# Patient Record
Sex: Female | Born: 1985 | Race: White | Hispanic: No | Marital: Married | Smoking: Never smoker
Health system: Southern US, Community
[De-identification: ages and names within clinical notes are randomized; demographics above are authoritative.]

## PROBLEM LIST (undated history)

## (undated) ENCOUNTER — Inpatient Hospital Stay (HOSPITAL_COMMUNITY): Payer: BC Managed Care – PPO

## (undated) ENCOUNTER — Inpatient Hospital Stay (HOSPITAL_COMMUNITY): Payer: Self-pay

## (undated) DIAGNOSIS — F419 Anxiety disorder, unspecified: Secondary | ICD-10-CM

## (undated) DIAGNOSIS — O139 Gestational [pregnancy-induced] hypertension without significant proteinuria, unspecified trimester: Secondary | ICD-10-CM

## (undated) DIAGNOSIS — R519 Headache, unspecified: Secondary | ICD-10-CM

## (undated) DIAGNOSIS — F909 Attention-deficit hyperactivity disorder, unspecified type: Secondary | ICD-10-CM

## (undated) DIAGNOSIS — N200 Calculus of kidney: Secondary | ICD-10-CM

## (undated) DIAGNOSIS — K589 Irritable bowel syndrome without diarrhea: Secondary | ICD-10-CM

## (undated) DIAGNOSIS — D649 Anemia, unspecified: Secondary | ICD-10-CM

## (undated) HISTORY — DX: Anxiety disorder, unspecified: F41.9

## (undated) HISTORY — PX: WISDOM TOOTH EXTRACTION: SHX21

## (undated) HISTORY — DX: Gestational (pregnancy-induced) hypertension without significant proteinuria, unspecified trimester: O13.9

## (undated) HISTORY — DX: Irritable bowel syndrome, unspecified: K58.9

## (undated) HISTORY — DX: Attention-deficit hyperactivity disorder, unspecified type: F90.9

---
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
## 2004-09-06 ENCOUNTER — Other Ambulatory Visit: Admission: RE | Admit: 2004-09-06 | Discharge: 2004-09-06 | Payer: Self-pay | Admitting: Obstetrics and Gynecology

## 2007-01-17 ENCOUNTER — Ambulatory Visit (HOSPITAL_COMMUNITY): Admission: RE | Admit: 2007-01-17 | Discharge: 2007-01-17 | Payer: Self-pay | Admitting: Obstetrics and Gynecology

## 2008-08-11 IMAGING — US US SOFT TISSUE HEAD/NECK
1 series · 14 of 25 positions shown · non-contrast
Comparison: none

CLINICAL DATA: Thyromegaly, abnormal labs

Thyroid ultrasound:
No previous for comparison. The right lobe measures 12 x 16 x 51 mm, somewhat
inhomogeneous echotexture without discrete lesion. Isthmus 2 mm in total
thickness. Left lobe 9 x 12 x 40 mm, with similar mild inhomogeneity of the
parenchyma but no discrete lesion.

[Series 1: unknown · 0.09mm/px · 14 of 32 slices shown]
[im 1/32]
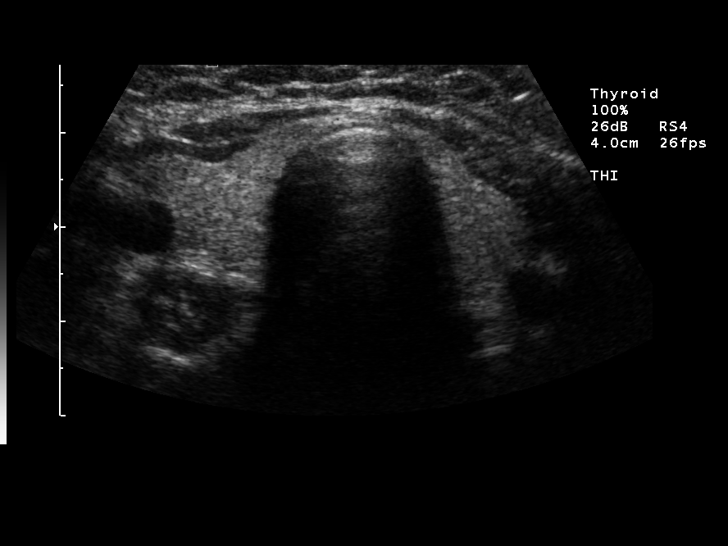
[im 3/32]
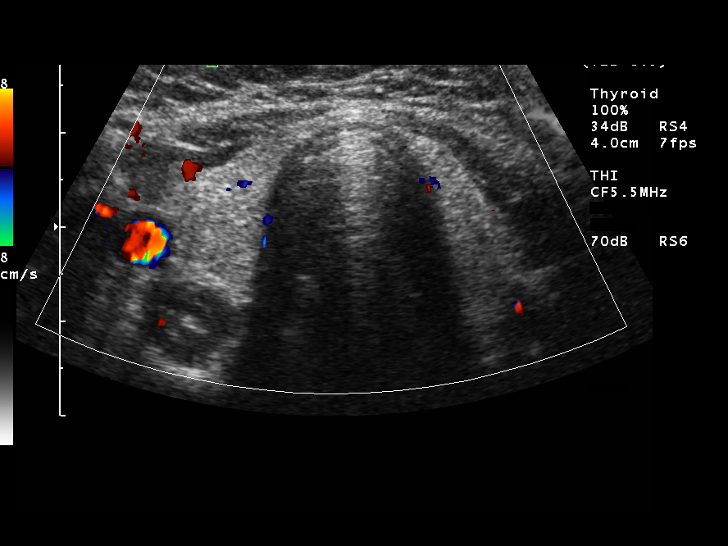
[im 6/32]
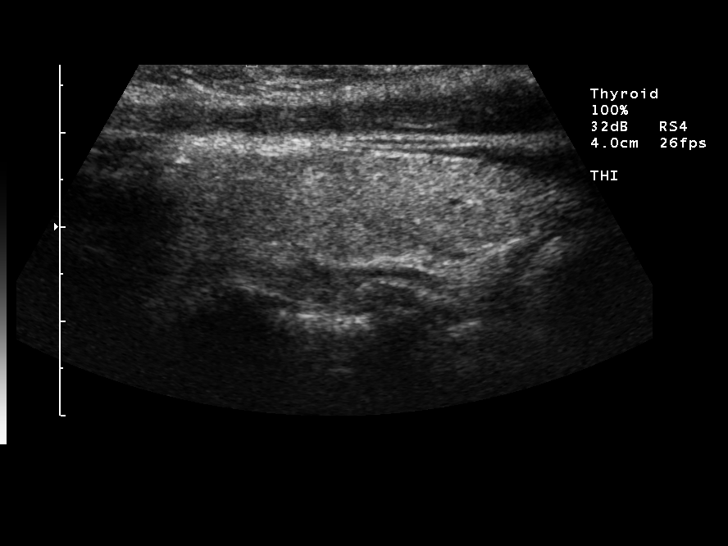
[im 8/32]
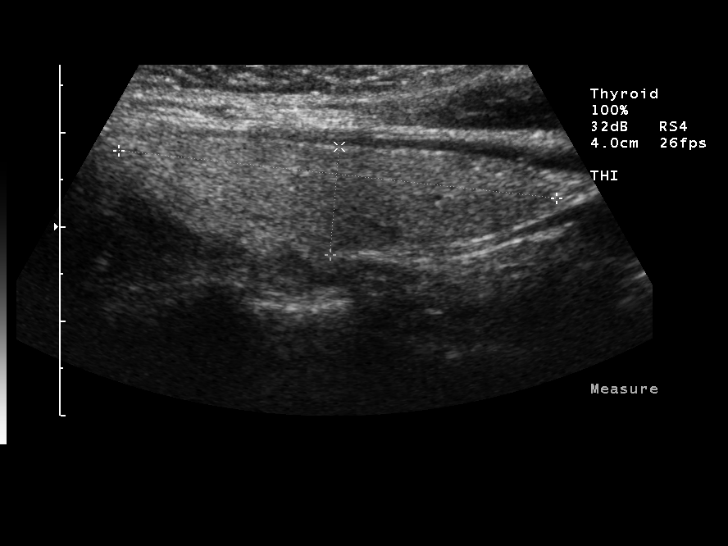
[im 11/32]
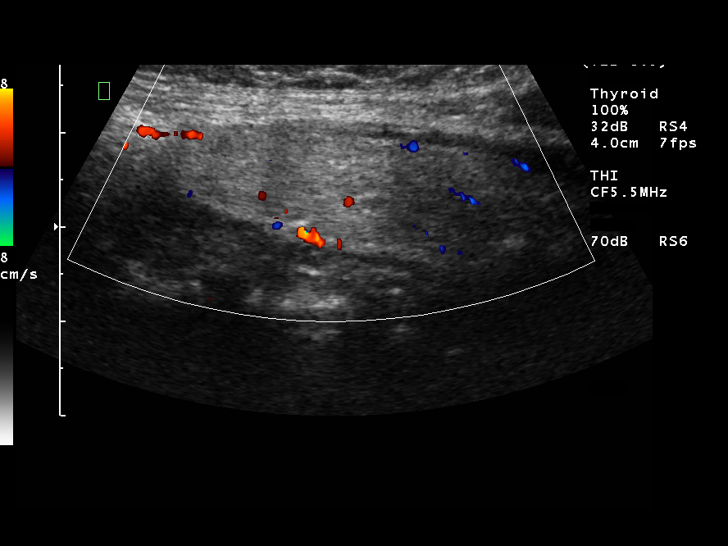
[im 12/32]
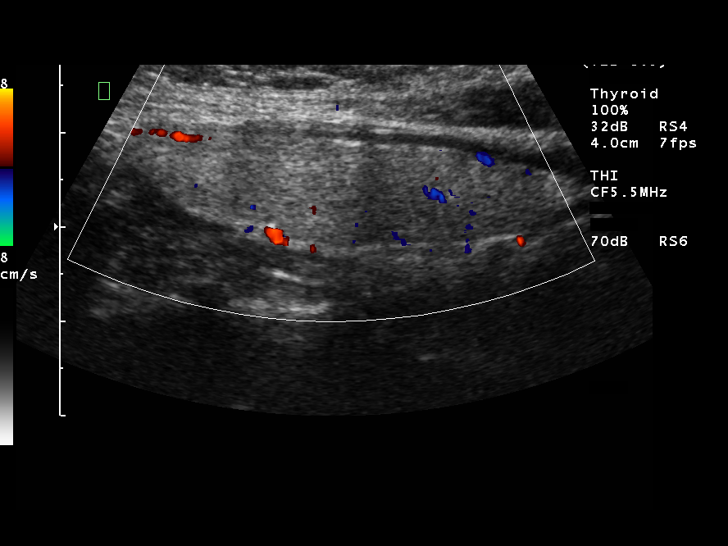
[im 15/32]
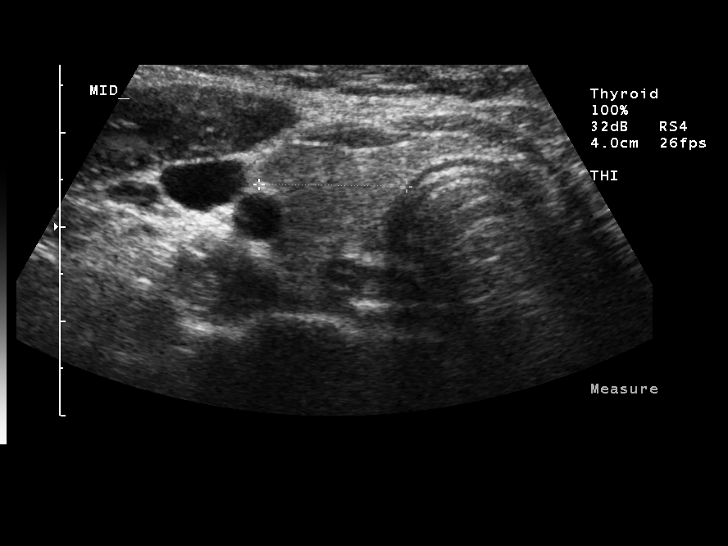
[im 17/32]
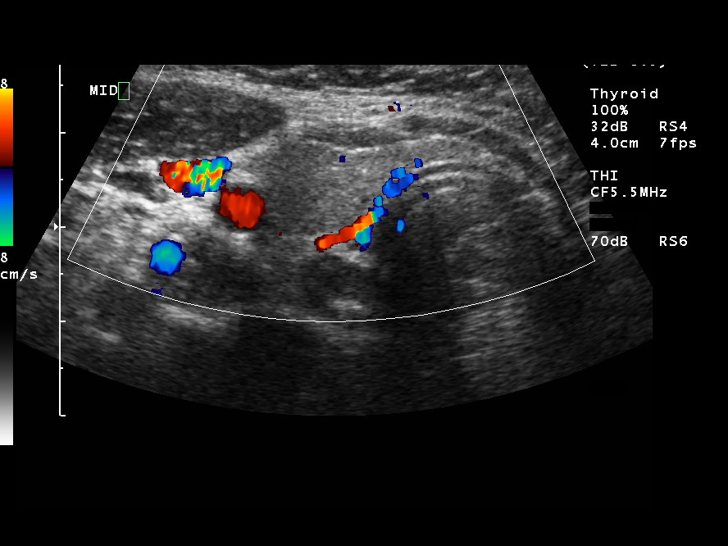
[im 20/32]
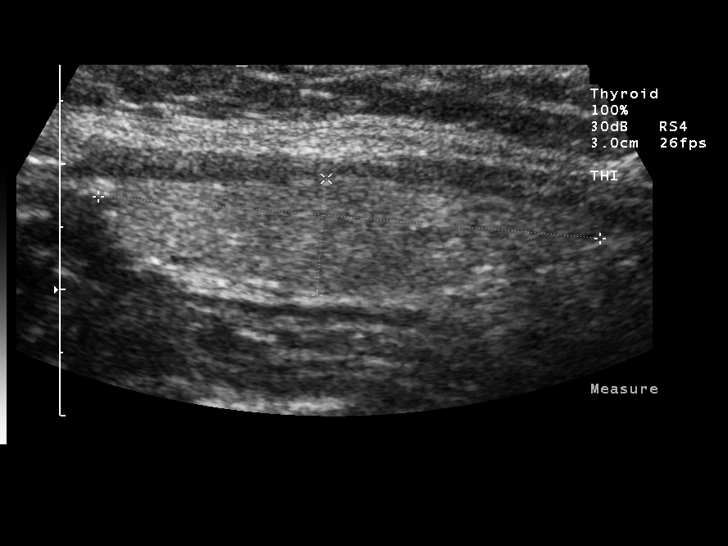
[im 21/32]
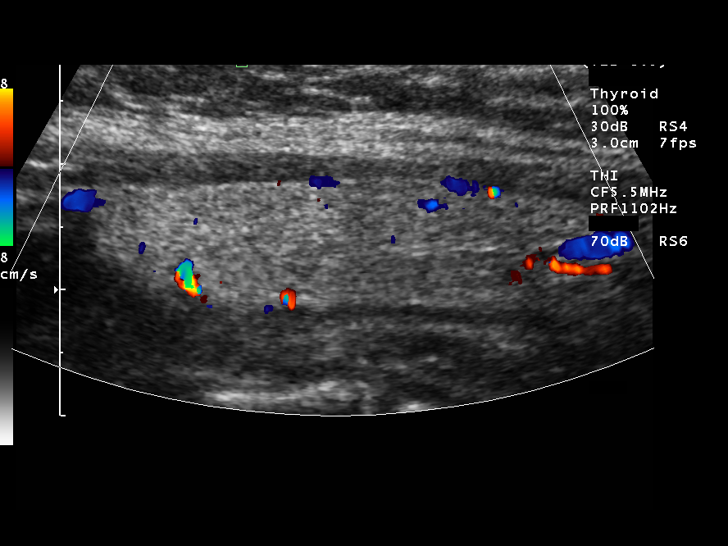
[im 24/32]
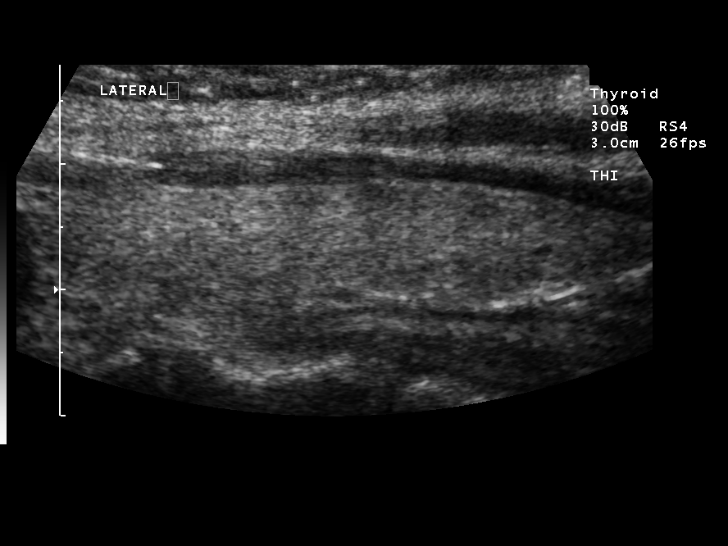
[im 26/32]
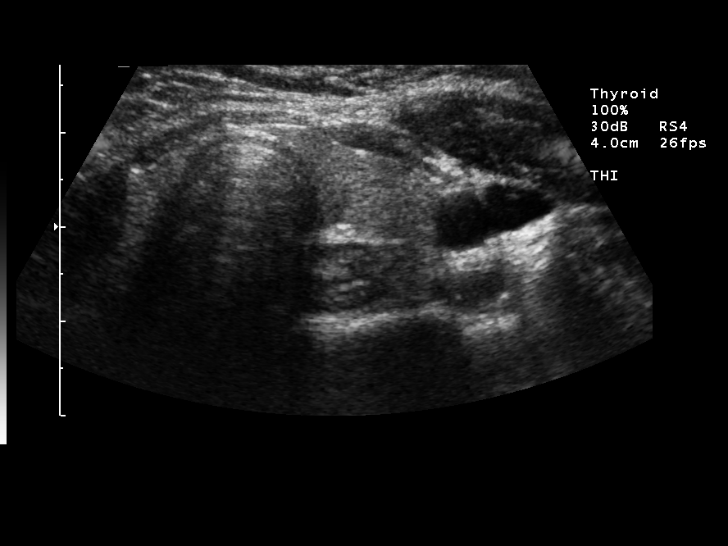
[im 29/32]
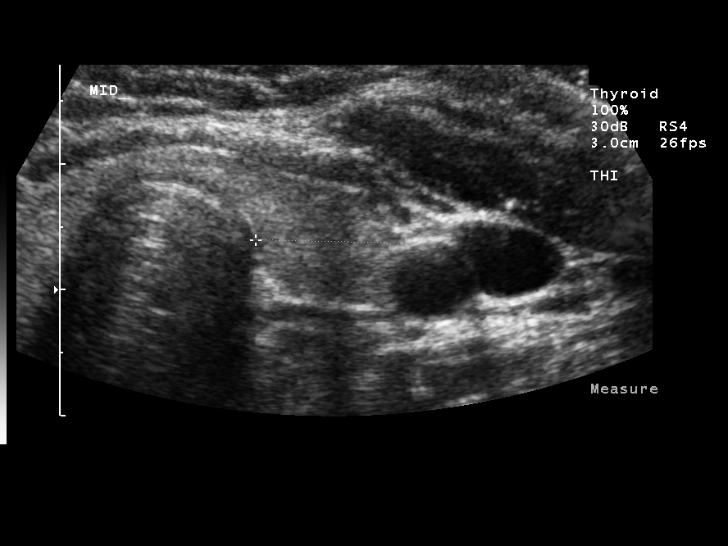
[im 32/32]
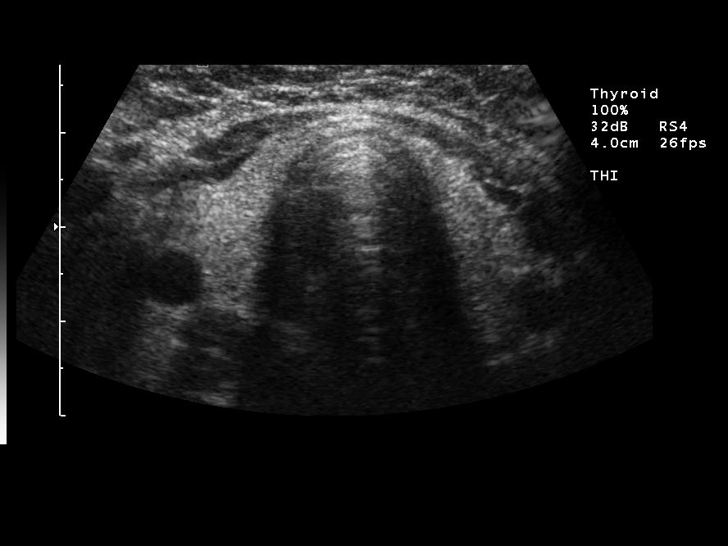

[14 of 25 positions shown; findings below may reference images not displayed]

IMPRESSION: 1. Mild parenchymal inhomogeneity with no dominant nodule or other focal lesion.

## 2010-06-05 ENCOUNTER — Encounter: Payer: Self-pay | Admitting: Endocrinology

## 2010-08-02 ENCOUNTER — Emergency Department (HOSPITAL_COMMUNITY)
Admission: EM | Admit: 2010-08-02 | Discharge: 2010-08-02 | Disposition: A | Discharge status: Home / Self Care | Payer: 59 | Attending: Emergency Medicine | Admitting: Emergency Medicine

## 2010-08-02 DIAGNOSIS — IMO0001 Reserved for inherently not codable concepts without codable children: Secondary | ICD-10-CM | POA: Insufficient documentation

## 2010-08-02 DIAGNOSIS — R509 Fever, unspecified: Secondary | ICD-10-CM | POA: Insufficient documentation

## 2010-08-02 DIAGNOSIS — R51 Headache: Secondary | ICD-10-CM | POA: Insufficient documentation

## 2010-08-02 DIAGNOSIS — R599 Enlarged lymph nodes, unspecified: Secondary | ICD-10-CM | POA: Insufficient documentation

## 2010-08-02 DIAGNOSIS — J029 Acute pharyngitis, unspecified: Secondary | ICD-10-CM | POA: Insufficient documentation

## 2010-08-02 DIAGNOSIS — R5383 Other fatigue: Secondary | ICD-10-CM | POA: Insufficient documentation

## 2010-08-02 DIAGNOSIS — R5381 Other malaise: Secondary | ICD-10-CM | POA: Insufficient documentation

## 2010-08-02 DIAGNOSIS — R000 Tachycardia, unspecified: Secondary | ICD-10-CM | POA: Insufficient documentation

## 2010-08-02 DIAGNOSIS — R110 Nausea: Secondary | ICD-10-CM | POA: Insufficient documentation

## 2010-08-03 LAB — STREP A DNA PROBE: Group A Strep Probe: NEGATIVE

## 2011-11-05 ENCOUNTER — Emergency Department (HOSPITAL_COMMUNITY)
Admission: EM | Admit: 2011-11-05 | Discharge: 2011-11-05 | Disposition: A | Discharge status: Home / Self Care | Payer: 59 | Attending: Emergency Medicine | Admitting: Emergency Medicine

## 2011-11-05 ENCOUNTER — Encounter (HOSPITAL_COMMUNITY): Payer: Self-pay | Admitting: Emergency Medicine

## 2011-11-05 DIAGNOSIS — Z79899 Other long term (current) drug therapy: Secondary | ICD-10-CM | POA: Insufficient documentation

## 2011-11-05 DIAGNOSIS — R109 Unspecified abdominal pain: Secondary | ICD-10-CM | POA: Insufficient documentation

## 2011-11-05 DIAGNOSIS — Z87442 Personal history of urinary calculi: Secondary | ICD-10-CM | POA: Insufficient documentation

## 2011-11-05 HISTORY — DX: Calculus of kidney: N20.0

## 2011-11-05 LAB — URINALYSIS, ROUTINE W REFLEX MICROSCOPIC
Glucose, UA: NEGATIVE mg/dL
Hgb urine dipstick: NEGATIVE
Leukocytes, UA: NEGATIVE
Specific Gravity, Urine: 1.031 — ABNORMAL HIGH (ref 1.005–1.030)
pH: 6.5 (ref 5.0–8.0)

## 2011-11-05 LAB — POCT I-STAT, CHEM 8
BUN: 8 mg/dL (ref 6–23)
Chloride: 103 mEq/L (ref 96–112)
HCT: 37 % (ref 36.0–46.0)
Sodium: 141 mEq/L (ref 135–145)

## 2011-11-05 LAB — POCT PREGNANCY, URINE: Preg Test, Ur: NEGATIVE

## 2011-11-05 MED ORDER — KETOROLAC TROMETHAMINE 30 MG/ML IJ SOLN: 30.0000 mg | Freq: Once | INTRAMUSCULAR | Status: AC

## 2011-11-05 MED ORDER — OXYCODONE-ACETAMINOPHEN 5-325 MG PO TABS: 1.0000 | ORAL_TABLET | ORAL | Status: AC | PRN

## 2011-11-05 MED ORDER — SODIUM CHLORIDE 0.9 % IV BOLUS (SEPSIS)
1000.0000 mL | Freq: Once | INTRAVENOUS | Status: AC
  Administered 2011-11-05: 1000 mL via INTRAVENOUS

## 2011-11-05 MED ORDER — KETOROLAC TROMETHAMINE 30 MG/ML IJ SOLN
30.0000 mg | Freq: Once | INTRAMUSCULAR | Status: DC
  Administered 2011-11-05: 30 mg via INTRAMUSCULAR
  Filled 2011-11-05: qty 1

## 2011-11-05 MED ORDER — FENTANYL CITRATE 0.05 MG/ML IJ SOLN
100.0000 ug | Freq: Once | INTRAMUSCULAR | Status: AC
  Administered 2011-11-05: 100 ug via INTRAVENOUS
  Filled 2011-11-05: qty 2

## 2011-11-05 NOTE — ED Notes (Signed)
Pt reports severe L flank pain radaiting to LLA onset this AM has hx Kidney Stones

## 2011-11-05 NOTE — ED Provider Notes (Signed)
History     CSN: [id number]  Arrival date & time 11/05/11  0340   First MD Initiated Contact with Patient 11/05/11 0356      Chief Complaint  Patient presents with  . Abdominal Pain  . Flank Pain    Hx kidney stones   HPI  History provided by the patient. Patient is a 26 year old female with history of kidney stones who presents with complaints of left flank pain that began acutely around 11 PM last night. Patient states that symptoms felt similar to previous kidney stones. Pain does radiate to left lower abdomen and groin area. Patient denies any urinary complaints. She denies any urinary frequency, dysuria, or hematuria. Patient denies any vaginal bleeding or vaginal discharge. Patient does state that she had similar symptoms on what she tells a kidney stone earlier this month but thinks that she passed it on her own. She was evaluated at that time. Symptoms tonight have not been associated with any fever, chills, sweats, nausea vomiting. Patient did attempt to take a Percocet at home without relief of symptoms.  Past Medical History  Diagnosis Date  . Kidney stones     History reviewed. No pertinent past surgical history.  History reviewed. No pertinent family history.  History  Substance Use Topics  . Smoking status: Never Smoker   . Smokeless tobacco: Not on file  . Alcohol Use: Yes    OB History    Grav Para Term Preterm Abortions TAB SAB Ect Mult Living                  Review of Systems  Constitutional: Negative for fever and chills.  Gastrointestinal: Negative for nausea and vomiting.  Genitourinary: Positive for flank pain. Negative for dysuria, frequency, hematuria, vaginal bleeding and vaginal discharge.    Allergies  Morphine and related and Vicodin  Home Medications   Current Outpatient Rx  Name Route Sig Dispense Refill  . AMPHETAMINE-DEXTROAMPHET ER 25 MG PO CP24 Oral Take 25 mg by mouth daily.    . OXYCODONE-ACETAMINOPHEN 5-325 MG PO TABS Oral  Take 1 tablet by mouth once.    Marland Kitchen PRESCRIPTION MEDICATION Oral Take 1 tablet by mouth daily. Oral contraception      BP 122/108  Pulse 110  Temp 98.5 F (36.9 C) (Oral)  Resp 18  SpO2 98%  Physical Exam  Nursing note and vitals reviewed. Constitutional: She is oriented to person, place, and time. She appears well-developed and well-nourished. No distress.  HENT:  Head: Normocephalic and atraumatic.  Cardiovascular: Normal rate and regular rhythm.   Pulmonary/Chest: Effort normal and breath sounds normal.  Abdominal: Soft. There is tenderness in the left lower quadrant. There is CVA tenderness. There is no rebound, no guarding, no tenderness at McBurney's point and negative Murphy's sign.       Left CVA tenderness  Neurological: She is alert and oriented to person, place, and time.  Skin: Skin is warm and dry. No rash noted.  Psychiatric: She has a normal mood and affect. Her behavior is normal.    ED Course  Procedures   Results for orders placed during the hospital encounter of 11/05/11  URINALYSIS, ROUTINE W REFLEX MICROSCOPIC      Component Value Range   Color, Urine YELLOW  YELLOW   APPearance CLEAR  CLEAR   Specific Gravity, Urine 1.031 (*) 1.005 - 1.030   pH 6.5  5.0 - 8.0   Glucose, UA NEGATIVE  NEGATIVE mg/dL   Hgb urine dipstick  NEGATIVE  NEGATIVE   Bilirubin Urine NEGATIVE  NEGATIVE   Ketones, ur 15 (*) NEGATIVE mg/dL   Protein, ur NEGATIVE  NEGATIVE mg/dL   Urobilinogen, UA 1.0  0.0 - 1.0 mg/dL   Nitrite NEGATIVE  NEGATIVE   Leukocytes, UA NEGATIVE  NEGATIVE  POCT PREGNANCY, URINE      Component Value Range   Preg Test, Ur NEGATIVE  NEGATIVE  POCT I-STAT, CHEM 8      Component Value Range   Sodium 141  135 - 145 mEq/L   Potassium 3.6  3.5 - 5.1 mEq/L   Chloride 103  96 - 112 mEq/L   BUN 8  6 - 23 mg/dL   Creatinine, Ser 1.61  0.50 - 1.10 mg/dL   Glucose, Bld 096 (*) 70 - 99 mg/dL   Calcium, Ion 0.45  4.09 - 1.32 mmol/L   TCO2 22  0 - 100 mmol/L    Hemoglobin 12.6  12.0 - 15.0 g/dL   HCT 81.1  91.4 - 78.2 %       1. Flank pain       MDM  Patient seen and evaluated. Patient in no acute distress.  Patient reports having significant improvement of symptoms at this time. Blood tests and UA unremarkable. No signs for UTI. No hematuria. I discussed with patient further options for evaluation of symptoms. I discussed with patient options for pelvic exam to rule out gynecological etiology. Patient denies any vaginal symptoms of bleeding or discharge. She does not feel her symptoms are caused from ovary or uterus is not wish to have a pelvic performed at this time. We also discussed options for imaging studies to evaluate for possible kidney stones. Patient also does not wish to have any CAT scan or plain film x-ray and states her symptoms seem much more improved and she would rather followup with PCP for evaluation if needed. This is reasonable given her significant improvement of symptoms at this time we'll discharge home.      Angus Seller, [place] 11/05/11 [phone]

## 2011-11-05 NOTE — Discharge Instructions (Signed)
You were seen and evaluated today for your symptoms of left flank pain.  Your lab tests and urine tests did not show any signs for a concerning or emergent cause for your symptoms.  There were no signs for a urinary tract infection.  Your provider(s) today did discuss with you options for further testing including performing a pelvic exam to be sure your symptoms were not caused from a gynecological source such as your left ovary or uterus, or performing a CAT scan to look for a possible kidney stone.  At this time you reported feeling better and you did not wish to have any further testing or evaluation and wished to follow up with your primary doctor on Monday.  Please call their office on Monday for an appointment for further evaluation of your symptoms.  If your symptoms worsen you may return for further evaluation at any time.   Flank Pain Flank pain refers to pain that is located on the side of the body between the upper abdomen and the back. It can be caused by many things. CAUSES  Some of the more common causes of flank pain include:  Muscle strain.   Muscle spasms.   A disease of your spine (vertebral disk disease).   A lung infection (pneumonia).   Fluid around your lungs (pulmonary edema).   A kidney infection.   Kidney stones.   A very painful skin rash on only one side of your body (shingles).   Gallbladder disease.  DIAGNOSIS  Blood tests, urine tests, and X-rays may help your caregiver determine what is wrong. TREATMENT  The treatment of pain depends on the cause. Your caregiver will determine what treatment will work best for you. HOME CARE INSTRUCTIONS   Home care will depend on the cause of your pain.   Some medications may help relieve the pain. Take medication for relief of pain as directed by your caregiver.   Tell your caregiver about any changes in your pain.   Follow up with your caregiver.  SEEK IMMEDIATE MEDICAL CARE IF:   Your pain is not controlled  with medication.   The pain increases.   You have abdominal pain.   You have shortness of breath.   You have persistent nausea or vomiting.   You have swelling in your abdomen.   You feel faint or pass out.   You have a temperature by mouth above 102 F (38.9 C), not controlled by medicine.  MAKE SURE YOU:   Understand these instructions.   Will watch your condition.   Will get help right away if you are not doing well or get worse.  Document Released: 06/22/2005 Document Revised: 04/20/2011 Document Reviewed: 10/16/2009 [facility] Patient Information 2012 [place], [place].   RESOURCE GUIDE  Chronic Pain Problems: Contact [facility]  [phone] Patients need to be referred by their primary care doctor.  Insufficient Money for Medicine: Contact United Way:  call "211" or [facility] [phone].  No Primary Care Doctor: - Call [facility]  [phone] - can help you locate a primary care doctor that  accepts your insurance, provides certain services, etc. - Physician Referral [phone]  Agencies that provide inexpensive medical care: - [facility]  [phone] - [facility]  [phone] - [facility]  [phone] [facility]  [phone] - [facility]  [phone] [facility]  [phone]  Medicaid-accepting [facility] Providers: [facility]- [address]  [phone], Mon-Fri 9am-7pm, Sat 9am-1pm - [facility]- [address] [place], [place]  [phone] - [facility]- [address], [address] [place]  [phone] [facility]- [address]  [phone] - Renaye Rakers- [address] [place], [address], [phone]  Only accepts [place] patients after they have their name  applied to their card  Self Pay (no insurance) in [facility]: - Sickle Cell Patients: Dr  Willey Blade, [facility]  [address] [place], [phone] - [facility]- [address] [place]  [phone]       [facility] [place]- [address]       -     [facility]- see information above (Speak to [employer] if you do not have insurance)       -  [facility]- [address] [place], [phone]       -  [facility]- [address],  [phone]       -  [facility]- [address] [phone]       -  Dr Julio Sicks-  [address], [place], [phone]       -  [facility]- [address], [phone]       -  [facility]- [address], [phone], also [address] [phone]       -    [facility]- [address] [place], [phone], 1st & 3rd Saturday   every month, 10am-1pm  1) Find a Doctor and Pay Out of Pocket Although you won't have to find out who is covered by your insurance plan, it is a good idea to ask around and get recommendations. You will then need to call the office and see if the doctor you have chosen will accept you as a new patient and what types of options they offer for patients who are self-pay. Some doctors offer discounts or will set up payment plans for their patients who do not have insurance, but you will need to ask so you aren't surprised when you get to your appointment.  2) Contact Your Local Health Department Not all health departments have doctors that can see patients for sick visits, but many do, so it is worth a call to see if yours does. If you don't know where your local health department is, you can check in your phone book. The CDC also has a tool to help you locate your state's health department, and many state websites also have listings of all of their local health departments.  3) Find a Walk-in Clinic If your illness is not likely to be very severe or complicated, you may want to try a walk in clinic.  These are popping up all over the country in pharmacies, drugstores, and shopping centers. They're usually staffed by nurse practitioners or physician assistants that have been trained to treat common illnesses and complaints. They're usually fairly quick and inexpensive. However, if you have serious medical issues or chronic medical problems, these are probably not your best option  STD Testing - [facility] Department of [facility] [place], STD Clinic, [address] [place], phone [phone] or [phone].  Monday - Friday, call for an appointment. [facility] Department of [employer]  [place], STD Clinic, [address], [place], phone [phone] or [phone].  Monday - Friday, call for an appointment.  Abuse/Neglect: [facility] Child Abuse Hotline (940)738-4898 [facility] Child Abuse Hotline (670)514-6909 (After Hours)  Emergency Shelter:  [facility] (934)327-8961  Maternity Homes: - [facility] at [facility] [place] [facility] 814-626-3958 - [facility] (832)516-7819  MRSA Hotline #:   434-882-7966  [facility] Resources  [facility] [place]  [facility] Dept. [address]                 [address]         [address] [place] [address]  Blondell Reveal Phone:  [phone]                                  Phone:  [phone]                   Phone:  9704639696  [facility], [phone] - [facility] - [phone]       -     [facility] in [place], [address]                                  [phone], [facility] Child Abuse Hotline 763-022-5150 or 6416664511 (After Hours)   Behavioral Health Services  Substance Abuse Resources: - Alcohol and Drug Services   (662)481-1278 - [facility] [phone] - [facility] [place] [phone] Floydene Flock [phone] - Residential & Outpatient Substance Abuse Program  [phone]  Psychological Services: [facility]  [phone] Services  3435001568 - [facility], [phone] [place]. [address] [place], ACCESS LINE: 5641646484 or [phone], [email or web address]  Dental Assistance  If unable to pay or uninsured, contact:  [facility] or [facility]. to become qualified for the adult dental clinic.  Patients with Medicaid: [facility] 6573860843 [address], [phone] [address] [phone]  If unable to pay, or uninsured, contact [facility] [phone]) or [facility] Department [phone] in [place], [phone] in [facility]) to become qualified for the adult dental clinic  Other Low-Cost Community Dental Services: - [facility]- [address] [place], [place], [place], [address], [phone], [address], 2nd and 4th Thursday of the month at 6:30am.  10 clients each day by appointment, can sometimes see walk-in patients if someone does not show for an appointment. [facility]- [address] [place], [place], [address], [phone] - [facility]- [address] [place], [place], [address], [phone] - [facility]- [phone] [facility] Department- [phone] [facility] Department- [phone]

## 2011-11-05 NOTE — ED Provider Notes (Signed)
Medical screening examination/treatment/procedure(s) were performed by non-physician practitioner and as supervising physician I was immediately available for consultation/collaboration.  Jamari Moten, MD 11/05/11 0727 

## 2013-02-22 ENCOUNTER — Emergency Department (HOSPITAL_BASED_OUTPATIENT_CLINIC_OR_DEPARTMENT_OTHER)
Admission: EM | Admit: 2013-02-22 | Discharge: 2013-02-23 | Disposition: A | Discharge status: Home / Self Care | Payer: 59 | Attending: Emergency Medicine | Admitting: Emergency Medicine

## 2013-02-22 ENCOUNTER — Encounter (HOSPITAL_BASED_OUTPATIENT_CLINIC_OR_DEPARTMENT_OTHER): Payer: Self-pay | Admitting: Emergency Medicine

## 2013-02-22 ENCOUNTER — Emergency Department (HOSPITAL_BASED_OUTPATIENT_CLINIC_OR_DEPARTMENT_OTHER): Payer: 59

## 2013-02-22 DIAGNOSIS — Z87442 Personal history of urinary calculi: Secondary | ICD-10-CM | POA: Insufficient documentation

## 2013-02-22 DIAGNOSIS — Z79899 Other long term (current) drug therapy: Secondary | ICD-10-CM | POA: Insufficient documentation

## 2013-02-22 DIAGNOSIS — T148XXA Other injury of unspecified body region, initial encounter: Secondary | ICD-10-CM

## 2013-02-22 DIAGNOSIS — Y9289 Other specified places as the place of occurrence of the external cause: Secondary | ICD-10-CM | POA: Insufficient documentation

## 2013-02-22 DIAGNOSIS — Y9301 Activity, walking, marching and hiking: Secondary | ICD-10-CM | POA: Insufficient documentation

## 2013-02-22 DIAGNOSIS — W230XXA Caught, crushed, jammed, or pinched between moving objects, initial encounter: Secondary | ICD-10-CM | POA: Insufficient documentation

## 2013-02-22 DIAGNOSIS — S9000XA Contusion of unspecified ankle, initial encounter: Secondary | ICD-10-CM | POA: Insufficient documentation

## 2013-02-22 DIAGNOSIS — IMO0002 Reserved for concepts with insufficient information to code with codable children: Secondary | ICD-10-CM | POA: Insufficient documentation

## 2013-02-22 NOTE — ED Provider Notes (Signed)
CSN: [id number]     Arrival date & time 02/22/13  2318 History   This chart was scribed for Tierany Appleby Smitty Cords, MD by Ronal Fear, ED Scribe. This patient was seen in room MH06/MH06 and the patient's care was started at 11:34 PM.   Chief Complaint  Patient presents with  . Ankle Pain    Patient is a 27 y.o. female presenting with ankle pain. The history is provided by the patient. No language interpreter was used.  Ankle Pain Location:  Ankle Injury: yes   Mechanism of injury comment:  Got foot caught in railroad tracks Ankle location:  L ankle Pain details:    Quality:  Aching   Radiates to:  Does not radiate   Severity:  Mild   Onset quality:  Sudden   Timing:  Rare   Progression:  Improving Chronicity:  New Foreign body present:  No foreign bodies Tetanus status:  Up to date Prior injury to area:  No Relieved by:  Nothing Worsened by:  Bearing weight Associated symptoms: swelling   Associated symptoms: no back pain, no muscle weakness, no numbness and no tingling   Risk factors: no concern for non-accidental trauma   pt was walking by railroad tracks and her foot got caught between the tracks around 1:30 yesterday   Past Medical History  Diagnosis Date  . Kidney stones    History reviewed. No pertinent past surgical history. History reviewed. No pertinent family history. History  Substance Use Topics  . Smoking status: Never Smoker   . Smokeless tobacco: Not on file  . Alcohol Use: Yes   OB History   Grav Para Term Preterm Abortions TAB SAB Ect Mult Living                 Review of Systems  Musculoskeletal: Positive for arthralgias. Negative for back pain.  All other systems reviewed and are negative.    Allergies  Morphine and related and Vicodin  Home Medications   Current Outpatient Rx  Name  Route  Sig  Dispense  Refill  . amphetamine-dextroamphetamine (ADDERALL XR) 25 MG 24 hr capsule   Oral   Take 25 mg by mouth daily.         Marland Kitchen  oxyCODONE-acetaminophen (PERCOCET) 5-325 MG per tablet   Oral   Take 1 tablet by mouth once.         Marland Kitchen PRESCRIPTION MEDICATION   Oral   Take 1 tablet by mouth daily. Oral contraception          BP 130/78  Pulse 72  Temp(Src) 98.2 F (36.8 C) (Oral)  Resp 18  Ht 5\' 3"  (1.6 m)  Wt 160 lb (72.576 kg)  BMI 28.35 kg/m2  SpO2 100% Physical Exam  Nursing note and vitals reviewed. Constitutional: She is oriented to person, place, and time.  Awake, alert, nontoxic appearance with baseline speech for patient.  HENT:  Head: Atraumatic.  Mouth/Throat: No oropharyngeal exudate.  Eyes: EOM are normal. Pupils are equal, round, and reactive to light. Right eye exhibits no discharge. Left eye exhibits no discharge.  Neck: Neck supple.  Cardiovascular: Normal rate and regular rhythm.   No murmur heard. Pulmonary/Chest: Effort normal and breath sounds normal. No stridor. No respiratory distress. She has no wheezes. She has no rales. She exhibits no tenderness.  Abdominal: Soft. Bowel sounds are normal. She exhibits no mass. There is no tenderness. There is no rebound.  Musculoskeletal: Normal range of motion. She exhibits no tenderness.  Baseline ROM, moves extremities with no obvious  focal weakness. 2+ dorsalis pedis. cap refill <3sec to all digits of the left foot. NVI achilles tendon intact  3 inch ecchymosis with central abrasion over the lateral left ankle  Lymphadenopathy:    She has no cervical adenopathy.  Neurological: She is alert and oriented to person, place, and time. She has normal reflexes.  Awake, alert, cooperative and aware of situation; motor strength bilaterally; sensation normal to light touch bilaterally; peripheral visual fields full to confrontation; no facial asymmetry; tongue midline; major cranial nerves appear intact; no pronator drift, normal finger to nose bilaterally, baseline gait without new ataxia.  Skin: Skin is warm and dry. No rash noted.  Psychiatric:  She has a normal mood and affect.    ED Course  Procedures (including critical care time)  DIAGNOSTIC STUDIES:   COORDINATION OF CARE:  11:37 PM- Pt advised of plan for treatment including X-ray. elevating the leg, icing it and taking ibuprofen for pain and an ace wrapand pt agrees.    Labs Review Labs Reviewed - No data to display Imaging Review No results found.  EKG Interpretation   None       MDM  No diagnosis found. Ecchymosis.  Ace wrap ice elevation and NSAIDs.    I personally performed the services described in this documentation, which was scribed in my presence. The recorded information has been reviewed and is accurate.     Jasmine Awe, MD 02/23/13 [phone]

## 2013-02-22 NOTE — ED Notes (Signed)
Patient injured her ankle while walking.  Patient left ankle was caught inside the metal tracks.  Abrasion to left ankle, swelling and bruising noted

## 2013-02-23 MED ORDER — IBUPROFEN 800 MG PO TABS: 800.0000 mg | ORAL_TABLET | Freq: Three times a day (TID) | ORAL | Status: DC

## 2013-02-23 MED ORDER — IBUPROFEN 800 MG PO TABS
ORAL_TABLET | ORAL | Status: AC
  Administered 2013-02-23: 800 mg
  Filled 2013-02-23: qty 1

## 2014-10-29 ENCOUNTER — Ambulatory Visit (INDEPENDENT_AMBULATORY_CARE_PROVIDER_SITE_OTHER): Payer: 59 | Admitting: Family Medicine

## 2014-10-29 VITALS — BP 112/70 | HR 98 | Temp 98.2°F | Resp 18 | Ht 63.0 in | Wt 188.8 lb

## 2014-10-29 DIAGNOSIS — L818 Other specified disorders of pigmentation: Secondary | ICD-10-CM

## 2014-10-29 DIAGNOSIS — R21 Rash and other nonspecific skin eruption: Secondary | ICD-10-CM | POA: Diagnosis not present

## 2014-10-29 DIAGNOSIS — L819 Disorder of pigmentation, unspecified: Secondary | ICD-10-CM | POA: Diagnosis not present

## 2014-10-29 LAB — POCT SKIN KOH: SKIN KOH, POC: NEGATIVE

## 2014-10-29 MED ORDER — METHYLPREDNISOLONE ACETATE 80 MG/ML IJ SUSP
120.0000 mg | Freq: Once | INTRAMUSCULAR | Status: AC
  Administered 2014-10-29: 120 mg via INTRAMUSCULAR

## 2014-10-29 MED ORDER — PREDNISONE 20 MG PO TABS: 40.0000 mg | ORAL_TABLET | Freq: Every day | ORAL | Status: DC

## 2014-10-29 NOTE — Progress Notes (Signed)
   Subjective:    Patient ID: Teresa Curry, female    DOB: 06-10-1985, 29 y.o.   MRN: [id number]  Chief Complaint  Patient presents with  . Rash    x yesterday, all over body, itcy   There are no active problems to display for this patient.  Medications, allergies, past medical history, surgical history, family history, social history and problem list reviewed and updated.  HPI  29 yof presents with rash.   She awoke yest morning with rash all over body. Bilateral legs, chest, and shoulders have rash. Was not itchy yest but has been slightly itchy today. Has tried benadryl with no relief. States she has sensitive skin and has had numerous rashes in past. She had a steroid shot few yrs ago for a rash at that time.   Only new exposure she can recall is new exfoliating sponge started using 2 days ago. [place] but states she does this every weekend.    She is in a wedding this weekend and is concerned about getting the rash resolved as quickly as possible.   Denies fevers, chills, n/v, mouth lesions.   Review of Systems See HPI.     Objective:   Physical Exam  Constitutional: She is oriented to person, place, and time. She appears well-developed and well-nourished.  Non-toxic appearance. She does not have a sickly appearance. She does not appear ill. No distress.  BP 112/70 mmHg  Pulse 98  Temp(Src) 98.2 F (36.8 C) (Oral)  Resp 18  Ht 5\' 3"  (1.6 m)  Wt 188 lb 12.8 oz (85.639 kg)  BMI 33.45 kg/m2  SpO2 98%  LMP 10/25/2014   Neurological: She is alert and oriented to person, place, and time.  Skin:  Diffuse papules with surrounding hypopigmented halos around each papule on bilateral legs. Halos all approx 0.5 cm in size.   Small papular rash on chest and bilateral superior shoulders without surrounding halo on these lesions.   Psychiatric: She has a normal mood and affect. Her speech is normal and behavior is normal.   Results for  orders placed or performed in visit on 10/29/14  POCT Skin KOH  Result Value Ref Range   Skin KOH, POC Negative       Assessment & Plan:   29 yof presents with rash.  Rash and nonspecific skin eruption - Plan: POCT Skin KOH, methylPREDNISolone acetate (DEPO-MEDROL) injection 120 mg, predniSONE (DELTASONE) 20 MG tablet Idiopathic guttate hypomelanosis --Case discussed and pt examined with both Dr Clelia Croft and Dr Milus Glazier --Unsure of exact etiology at this time, koh skin test negative, rash is slightly diff on chest/shoulders vs rash on bilateral legs --possible idiopathic guttate hypomelanosis with excessive recent sun/tanning bed exposure --possible bacterial infection with recent lake swimming --will tx today with depomedrol and start oral pred burst if rash returns in few days --if rash persists or returns despite these measures can tx for possible bacterial infection  Donnajean Lopes, PA-C Physician Assistant-Certified [facility]  10/29/2014 3:11 PM

## 2014-10-29 NOTE — Patient Instructions (Signed)
We are unsure of the exact cause of your rash. It is not fungal. We gave you a steroid shot today. This should resolve the rash. If you notice the rash coming back over the next few days please take the oral steroid 40 mg day for 3 days. You may have something named idiopathic guttate hypomelanosis.  The main way to deal with this is to avoid excess sun exposure and tanning beds.  I recommend avoiding these as much as possible to see if this helps as well. If the rash comes back please come back to see [place] asap.

## 2014-11-04 NOTE — Progress Notes (Signed)
Patient ID: Teresa Curry, female   DOB: February 08, 1986, 29 y.o.   MRN: [id number] Pt assessed, reviewed documentation and agree w/ assessment and plan.  Her lower extrimiety rash is very unusual - both of her legs appear to be polka-dotted with a relatively uniform smattering of many hypopigmented perfectly round macules approx 5-36mm dm - many w/ a tiny pinpoint papule midly erythematous in center - has approx 30 or 40 of these all over the entirity of both legs - hip to feet but none on trunk or upper ext.  Then has erythemaous pinpoint vesicles clustered over tops of shoulders and chest - areas that get most sun exposure. Pt is VERY tan - tans freq. Norberto Sorenson, MD MPH

## 2017-02-27 ENCOUNTER — Telehealth: Payer: Self-pay | Admitting: Internal Medicine

## 2017-02-27 NOTE — Telephone Encounter (Signed)
Unfortunately, I do require diagnosis by an expert psychologist or psychiatrist.

## 2017-02-27 NOTE — Telephone Encounter (Signed)
Patient scheduled a new patient appointment with Rene Kocher.  Patient was a patient of Emmie Niemann.  Patient said she was never diagnosed with ADHD, but has been on the medication for 12 years.  Patient wanted to know if Rene Kocher would require her to be diagnosed, because she can't afford the test. Patient has other doctors that she has seen before Tomi Bamberger that show she was on the medication.  Patient said a detailed message can be left on her voice mail, if she doesn't answer.

## 2017-02-27 NOTE — Telephone Encounter (Signed)
Pt is aware as instructed and expressed understanding 

## 2017-03-15 ENCOUNTER — Ambulatory Visit: Payer: Self-pay | Admitting: Internal Medicine

## 2017-04-13 ENCOUNTER — Ambulatory Visit (INDEPENDENT_AMBULATORY_CARE_PROVIDER_SITE_OTHER): Payer: BLUE CROSS/BLUE SHIELD | Admitting: Nurse Practitioner

## 2017-04-13 ENCOUNTER — Encounter: Payer: Self-pay | Admitting: Nurse Practitioner

## 2017-04-13 VITALS — BP 130/80 | HR 84 | Temp 98.1°F | Ht 63.0 in | Wt 223.0 lb

## 2017-04-13 DIAGNOSIS — Z23 Encounter for immunization: Secondary | ICD-10-CM | POA: Diagnosis not present

## 2017-04-13 DIAGNOSIS — F988 Other specified behavioral and emotional disorders with onset usually occurring in childhood and adolescence: Secondary | ICD-10-CM

## 2017-04-13 DIAGNOSIS — Z0001 Encounter for general adult medical examination with abnormal findings: Secondary | ICD-10-CM | POA: Insufficient documentation

## 2017-04-13 DIAGNOSIS — R030 Elevated blood-pressure reading, without diagnosis of hypertension: Secondary | ICD-10-CM

## 2017-04-13 NOTE — Assessment & Plan Note (Signed)
TDAP given today. Declines influenza vaccination. PAP smear by GYN Declines HIV screening. Health maintenance up to date.  Healthy diet and exercise discussed including reduced  meat and 1/2 plate of veggies at meals, getting 150 minutes of exercise per week. Sunscreen, seatbelt dicussed  She recently had a normal thyroid function test by GYN. No additional labs today.  Preventive care handout given.

## 2017-04-13 NOTE — Patient Instructions (Addendum)
Please work on your diet and exercise to reduce your blood pressure. Remember half of your plate should be veggies, one-fourth carbs, one-fourth meat, and don't eat meat at every meal. Also, remember to stay away from sugary drinks. Ideally you should be getting 30 minutes of exercise five times a week.   I have placed a referral to psychiatry for ADD testing. Our office will call you to schedule this appointment. You should hear from our office in 7-10 days.  I would like to see you back in about 3 months so we can check on your blood pressure.  It was nice to meet you. Welcome to [facility]!   Preventive Care 18-39 Years, Female Preventive care refers to lifestyle choices and visits with your health care provider that can promote health and wellness. What does preventive care include?  A yearly physical exam. This is also called an annual well check.  Dental exams once or twice a year.  Routine eye exams. Ask your health care provider how often you should have your eyes checked.  Personal lifestyle choices, including: ? Daily care of your teeth and gums. ? Regular physical activity. ? Eating a healthy diet. ? Avoiding tobacco and drug use. ? Limiting alcohol use. ? Practicing safe sex. ? Taking vitamin and mineral supplements as recommended by your health care provider. What happens during an annual well check? The services and screenings done by your health care provider during your annual well check will depend on your age, overall health, lifestyle risk factors, and family history of disease. Counseling Your health care provider may ask you questions about your:  Alcohol use.  Tobacco use.  Drug use.  Emotional well-being.  Home and relationship well-being.  Sexual activity.  Eating habits.  Work and work Statistician.  Method of birth control.  Menstrual cycle.  Pregnancy history.  Screening You may have the following tests or measurements:  Height, weight,  and BMI.  Diabetes screening. This is done by checking your blood sugar (glucose) after you have not eaten for a while (fasting).  Blood pressure.  Lipid and cholesterol levels. These may be checked every 5 years starting at age 54.  Skin check.  Hepatitis C blood test.  Hepatitis B blood test.  Sexually transmitted disease (STD) testing.  BRCA-related cancer screening. This may be done if you have a family history of breast, ovarian, tubal, or peritoneal cancers.  Pelvic exam and Pap test. This may be done every 3 years starting at age 24. Starting at age 33, this may be done every 5 years if you have a Pap test in combination with an HPV test.  Discuss your test results, treatment options, and if necessary, the need for more tests with your health care provider. Vaccines Your health care provider may recommend certain vaccines, such as:  Influenza vaccine. This is recommended every year.  Tetanus, diphtheria, and acellular pertussis (Tdap, Td) vaccine. You may need a Td booster every 10 years.  Varicella vaccine. You may need this if you have not been vaccinated.  HPV vaccine. If you are 98 or younger, you may need three doses over 6 months.  Measles, mumps, and rubella (MMR) vaccine. You may need at least one dose of MMR. You may also need a second dose.  Pneumococcal 13-valent conjugate (PCV13) vaccine. You may need this if you have certain conditions and were not previously vaccinated.  Pneumococcal polysaccharide (PPSV23) vaccine. You may need one or two doses if you smoke cigarettes or  if you have certain conditions.  Meningococcal vaccine. One dose is recommended if you are age 49-21 years and a first-year college student living in a residence hall, or if you have one of several medical conditions. You may also need additional booster doses.  Hepatitis A vaccine. You may need this if you have certain conditions or if you travel or work in places where you may be  exposed to hepatitis A.  Hepatitis B vaccine. You may need this if you have certain conditions or if you travel or work in places where you may be exposed to hepatitis B.  Haemophilus influenzae type b (Hib) vaccine. You may need this if you have certain risk factors.  Talk to your health care provider about which screenings and vaccines you need and how often you need them. This information is not intended to replace advice given to you by your health care provider. Make sure you discuss any questions you have with your health care provider. Document Released: 06/27/2001 Document Revised: 01/19/2016 Document Reviewed: 03/02/2015 Elsevier Interactive Patient Education  2017 [employer].

## 2017-04-13 NOTE — Progress Notes (Addendum)
Subjective:    Patient ID: Teresa Curry, female    DOB: [birth year], 31 y.o.   MRN: [id number]  HPI Teresa Curry is a 31 yo female who presents today to establish care. Patient presents today for complete physical.  Immunizations: TDAP-up to date Flu-declines Pap Smear: April 2107 Smoker: never Vision: current Dental: biannual cleanings Diet: Breakfast: skips Lunch: overeats-out to lunch every day Dinner: meat, fish, chicken and veggie- cooks at [employer] Water, 1 soda a day Exercise: Walking at least 5 days a week Sunscreen yes seatbelt yes  Review of Systems  Constitutional: Negative.  Negative for activity change and appetite change.  HENT: Negative for dental problem and voice change.   Eyes: Negative for visual disturbance.  Respiratory: Negative for cough and shortness of breath.   Cardiovascular: Negative for chest pain and palpitations.  Gastrointestinal: Negative for constipation and diarrhea.  Endocrine: Negative for cold intolerance and heat intolerance.  Genitourinary: Negative for difficulty urinating and hematuria.  Musculoskeletal: Negative for arthralgias and myalgias.  Skin: Negative for rash.  Allergic/Immunologic: Negative for environmental allergies and food allergies.  Neurological: Negative for speech difficulty.  Hematological: Does not bruise/bleed easily.  Psychiatric/Behavioral:       Negative for depression or anxiety.     ADD- She was maintained on adderall. she reports her prior provider "left town without giving me a refill." she has been out of her medication for some time. She is requesting a refill today. She has never been formally tested by psychiatry. She says she has trouble functioning at work due to inability to concentrate.  Past Medical History:  Diagnosis Date  . Kidney stones      Social History   Socioeconomic History  . Marital status: Married    Spouse name: Not on file  . Number of children: Not on file  . Years of  education: Not on file  . Highest education level: Not on file  Social Needs  . Financial resource strain: Not on file  . Food insecurity - worry: Not on file  . Food insecurity - inability: Not on file  . Transportation needs - medical: Not on file  . Transportation needs - non-medical: Not on file  Occupational History  . Not on file  Tobacco Use  . Smoking status: Never Smoker  . Smokeless tobacco: Never Used  Substance and Sexual Activity  . Alcohol use: Yes  . Drug use: No  . Sexual activity: Yes    Birth control/protection: Condom, Pill  Other Topics Concern  . Not on file  Social History Narrative  . Not on file    History reviewed. No pertinent surgical history.  Family History  Problem Relation Age of Onset  . Cancer Mother   . Cancer Maternal Grandmother     Allergies  Allergen Reactions  . Morphine And Related Nausea And Vomiting    nausea  . Vicodin [Hydrocodone-Acetaminophen] Itching    Current Outpatient Medications on File Prior to Visit  Medication Sig Dispense Refill  . amphetamine-dextroamphetamine (ADDERALL XR) 25 MG 24 hr capsule Take 25 mg by mouth daily.     No current facility-administered medications on file prior to visit.        Objective:   Physical Exam  BP 130/80 (BP Location: Left Arm, Patient Position: Sitting, Cuff Size: Large)   Pulse 84   Temp 98.1 F (36.7 C) (Oral)   Ht 5\' 3"  (1.6 m)   Wt 223 lb (101.2 kg)   LMP  03/26/2017   SpO2 98%   BMI 39.50 kg/m   General Appearance:    Alert, cooperative, no distress, appears stated age  Head:    Normocephalic, without obvious abnormality, atraumatic  Eyes:    PERRL, conjunctiva/corneas clear, EOM's intact  Ears:    Normal TM's and external ear canals, both ears  Nose:   Nares normal, septum midline, mucosa normal, no drainage    or sinus tenderness  Throat:   Lips, mucosa, and tongue normal; teeth and gums normal  Neck:   Supple, symmetrical, trachea midline, no adenopathy;     thyroid:  no enlargement/tenderness/nodules  Back:     Symmetric, no curvature, ROM normal, no CVA tenderness  Lungs:     Clear to auscultation bilaterally, respirations unlabored  Chest Wall:    No tenderness or deformity   Heart:    Regular rate and rhythm, heart sounds normal, no murmur     Abdomen:     Soft, non-tender, bowel sounds active all four quadrants,    no masses, no organomegaly        Extremities:   Extremities normal, atraumatic, no cyanosis or edema  Pulses:   2+ and symmetric all extremities  Skin:   Skin color, texture, turgor normal, no rashes or lesions  Lymph nodes:   Cervical, supraclavicular nodes normal  Neurologic:   CNII-XII intact, normal strength, sensation and reflexes    Throughout Psychiatric: Normal mood and behavior        Assessment & Plan:  Elevated blood pressure reading-  130/80, somewhat concerning given her age. She reports she has been somewhat sedentary and eating big lunches out everyday since getting married and starting a new job. Shed like to work on diet and exercise and come back in 3 months for a recheck.  ADD- We discussed referral for testing today, which she says she has had trouble affording the cost of in the past. We discussed the importance in formal testing and correct diagnosis for her safety prior to filling adderral and she voiced understanding.

## 2017-12-28 LAB — OB RESULTS CONSOLE GC/CHLAMYDIA
CHLAMYDIA, DNA PROBE: NEGATIVE
Gonorrhea: NEGATIVE

## 2017-12-28 LAB — OB RESULTS CONSOLE ABO/RH: RH Type: POSITIVE

## 2017-12-28 LAB — OB RESULTS CONSOLE RPR: RPR: NONREACTIVE

## 2017-12-28 LAB — OB RESULTS CONSOLE ANTIBODY SCREEN: Antibody Screen: NEGATIVE

## 2017-12-28 LAB — OB RESULTS CONSOLE HIV ANTIBODY (ROUTINE TESTING): HIV: NONREACTIVE

## 2017-12-28 LAB — OB RESULTS CONSOLE HEPATITIS B SURFACE ANTIGEN: Hepatitis B Surface Ag: NEGATIVE

## 2017-12-28 LAB — OB RESULTS CONSOLE RUBELLA ANTIBODY, IGM: Rubella: IMMUNE

## 2018-07-05 ENCOUNTER — Encounter (HOSPITAL_COMMUNITY): Payer: Self-pay | Admitting: *Deleted

## 2018-07-05 ENCOUNTER — Other Ambulatory Visit: Payer: Self-pay

## 2018-07-05 ENCOUNTER — Inpatient Hospital Stay (HOSPITAL_COMMUNITY)
Admission: AD | Admit: 2018-07-05 | Discharge: 2018-07-05 | Disposition: A | Discharge status: Home / Self Care | Payer: BLUE CROSS/BLUE SHIELD | Attending: Obstetrics and Gynecology | Admitting: Obstetrics and Gynecology

## 2018-07-05 DIAGNOSIS — Z3A36 36 weeks gestation of pregnancy: Secondary | ICD-10-CM | POA: Diagnosis not present

## 2018-07-05 DIAGNOSIS — O10013 Pre-existing essential hypertension complicating pregnancy, third trimester: Secondary | ICD-10-CM | POA: Insufficient documentation

## 2018-07-05 DIAGNOSIS — O4693 Antepartum hemorrhage, unspecified, third trimester: Secondary | ICD-10-CM

## 2018-07-05 DIAGNOSIS — O26853 Spotting complicating pregnancy, third trimester: Secondary | ICD-10-CM | POA: Insufficient documentation

## 2018-07-05 DIAGNOSIS — Z3689 Encounter for other specified antenatal screening: Secondary | ICD-10-CM

## 2018-07-05 LAB — URINALYSIS, MICROSCOPIC (REFLEX): WBC UA: NONE SEEN WBC/hpf (ref 0–5)

## 2018-07-05 LAB — URINALYSIS, ROUTINE W REFLEX MICROSCOPIC
BILIRUBIN URINE: NEGATIVE
Glucose, UA: NEGATIVE mg/dL
Ketones, ur: NEGATIVE mg/dL
Leukocytes,Ua: NEGATIVE
NITRITE: NEGATIVE
PH: 7 (ref 5.0–8.0)
Protein, ur: NEGATIVE mg/dL
SPECIFIC GRAVITY, URINE: 1.015 (ref 1.005–1.030)

## 2018-07-05 NOTE — MAU Provider Note (Addendum)
History     CSN: [id number]  Arrival date and time: 07/05/18 1609   First Provider Initiated Contact with Patient 07/05/18 1636      Chief Complaint  Patient presents with  . Vaginal Bleeding   HPI Teresa Curry is a 33 y.o. G1P0 at [redacted]w[redacted]d who presents to MAU with chief complaint of vaginal spotting.  Her cervix was checked at her prenatal appointment around 11am today. She noticed spotting when she wiped after voiding a short time later. She did not know if this was a normal outcome associated with vaginal exams and decided to present to MAU for evaluation. She denies continuous vaginal bleeding, leaking of fluid, decreased fetal movement, fever, falls, or recent illness.    Patient's pregnancy is complicated by Chronic Hypertension. She is unaware of this diagnosis. She endorses elevated blood pressures last fall due to "life stress" but states her blood pressures have been normal since that time. She is not prescribed medication for blood pressure. She denies SOB, headache, visual disturbances, new onset swelling, and RUQ pain.  OB History    Gravida  1   Para      Term      Preterm      AB      Living        SAB      TAB      Ectopic      Multiple      Live Births              Past Medical History:  Diagnosis Date  . Kidney stones     Past Surgical History:  Procedure Laterality Date  . WISDOM TOOTH EXTRACTION      Family History  Problem Relation Age of Onset  . Cancer Mother   . Cancer Maternal Grandmother     Social History   Tobacco Use  . Smoking status: Never Smoker  . Smokeless tobacco: Never Used  Substance Use Topics  . Alcohol use: Yes  . Drug use: No    Allergies:  Allergies  Allergen Reactions  . Morphine And Related Nausea And Vomiting    nausea  . Vicodin [Hydrocodone-Acetaminophen] Itching    Medications Prior to Admission  Medication Sig Dispense Refill Last Dose  . amphetamine-dextroamphetamine (ADDERALL XR) 25  MG 24 hr capsule Take 25 mg by mouth daily.   Taking    Review of Systems  Constitutional: Negative for chills, fatigue and fever.  Respiratory: Negative for shortness of breath.   Gastrointestinal: Negative for abdominal pain.  Genitourinary: Positive for vaginal bleeding. Negative for difficulty urinating, dyspareunia, dysuria, flank pain, vaginal discharge and vaginal pain.  Musculoskeletal: Negative for back pain.  Neurological: Negative for dizziness and headaches.  All other systems reviewed and are negative.  Physical Exam   Blood pressure (!) 157/97, pulse (!) 117, resp. rate 16, height 5\' 3"  (1.6 m), weight 122 kg, last menstrual period 10/24/2017.  Physical Exam  Nursing note and vitals reviewed. Constitutional: She is oriented to person, place, and time. She appears well-developed and well-nourished.  Respiratory: Effort normal. No respiratory distress.  GI: She exhibits no distension. There is no abdominal tenderness. There is no rebound and no guarding.  Gravid  Genitourinary:    Genitourinary Comments: Scant serosanguinous discharge on SSE Cervix visibly closed   Neurological: She is alert and oriented to person, place, and time.  Skin: Skin is warm and dry.  Psychiatric: She has a normal mood and affect. Her behavior  is normal. Thought content normal.    MAU Course/MDM  Procedures   --Prenatal records from Oct 2019 reflect diagnosis of Chronic Hypertension --Patient not currently on antihypertensives or daily Aspirin --Blood pressures at 1615 and 1630 obtained with small (blue) long cuff. Guide on cuff shows larger cuff is needed --Normotensive when correct sized cuff is used --Reactive tracing: baseline 145, moderate variability, positive accels, no decels --Toco: uterine irritability  --Negligible vaginal bleeding, reasonable s/p SVE in office  Patient Vitals for the past 24 hrs:  BP Pulse Resp SpO2 Height Weight  07/05/18 1717 134/72 (!) 104 - 98 % - -   07/05/18 1700 (!) 149/76 (!) 104 - 98 % - -  07/05/18 1629 (!) 157/97 (!) 117 16 - 5\' 3"  (1.6 m) 122 kg     Assessment and Plan  --33 y.o. G1P0 at [redacted]w[redacted]d  --Reactive tracing --Spotting after cervical exam in clinic this morning --Discharge home in stable condition  F/U: patient has OB appt Friday 07/12/18  Calvert Cantor, CNM 07/05/2018, 5:38 PM

## 2018-07-05 NOTE — Discharge Instructions (Signed)
Hypertension During Pregnancy ° °Hypertension is also called high blood pressure. High blood pressure means that the force of your blood moving in your body is too strong. When you are pregnant, this condition should be watched carefully. It can cause problems for you and your baby. °Follow these instructions at home: °Eating and drinking ° °· Drink enough fluid to keep your pee (urine) pale yellow. °· Avoid caffeine. °Lifestyle °· Do not use any products that contain nicotine or tobacco, such as cigarettes and e-cigarettes. If you need help quitting, ask your doctor. °· Do not use alcohol or drugs. °· Avoid stress. °· Rest and get plenty of sleep. °General instructions °· Take over-the-counter and prescription medicines only as told by your doctor. °· While lying down, lie on your left side. This keeps pressure off your major blood vessels. °· While sitting or lying down, raise (elevate) your feet. Try putting some pillows under your lower legs. °· Exercise regularly. Ask your doctor what kinds of exercise are best for you. °· Keep all prenatal and follow-up visits as told by your doctor. This is important. °Contact a doctor if: °· You have symptoms that your doctor told you to watch for, such as: °? Throwing up (vomiting). °? Feeling sick to your stomach (nausea). °? Headache. °Get help right away if you have: °· Very bad belly pain that does not get better with treatment. °· A very bad headache that does not get better. °· Throwing up that does not get better with treatment. °· Sudden, fast weight gain. °· Sudden swelling in your hands, ankles, or face. °· Bleeding from your vagina. °· Blood in your pee. °· Fewer movements from your baby than usual. °· Blurry vision. °· Double vision. °· Muscle twitching. °· Sudden muscle tightening (spasms). °· Trouble breathing. °· Blue fingernails or lips. °Summary °· Hypertension is also called high blood pressure. High blood pressure means that the force of your blood moving  in your body is too strong. °· When you are pregnant, this condition should be watched carefully. It can cause problems for you and your baby. °· Get help right away if you have symptoms that your doctor told you to watch for. °This information is not intended to replace advice given to you by your health care provider. Make sure you discuss any questions you have with your health care provider. °Document Released: 06/03/2010 Document Revised: 04/17/2017 Document Reviewed: 01/11/2016 °Elsevier Interactive Patient Education © 2019 [employer] ° °

## 2018-07-05 NOTE — MAU Note (Signed)
Pt presents to MAU with complaints of vaginal bleeding when she wipes. States she had an office visit today and DR Velvet Bathe checked her cervix and she noticed bleeding at that time, Denies any pain. +FM

## 2018-07-22 ENCOUNTER — Encounter (HOSPITAL_COMMUNITY): Payer: Self-pay | Admitting: *Deleted

## 2018-07-22 ENCOUNTER — Telehealth (HOSPITAL_COMMUNITY): Payer: Self-pay | Admitting: *Deleted

## 2018-07-22 LAB — OB RESULTS CONSOLE GBS: GBS: POSITIVE

## 2018-07-22 NOTE — Telephone Encounter (Signed)
Preadmission screen  

## 2018-07-22 NOTE — H&P (Signed)
Teresa Curry is a 33 y.o. female presenting for 2 stage IOL. Pregnancy complicated by hx of CHTN on no meds. BP [id number]. U/S in office2.28.20 noted vtx EFW 6# 15oz (68%), AFI 13.4, ptn negative. OB History    Gravida  1   Para      Term      Preterm      AB      Living        SAB      TAB      Ectopic      Multiple      Live Births             Past Medical History:  Diagnosis Date  . Kidney stones    Past Surgical History:  Procedure Laterality Date  . WISDOM TOOTH EXTRACTION     Family History: family history includes Cancer in her maternal grandmother and mother. Social History:  reports that she has never smoked. She has never used smokeless tobacco. She reports current alcohol use. She reports that she does not use drugs.     Maternal Diabetes: No Genetic Screening: Normal Maternal Ultrasounds/Referrals: Normal Fetal Ultrasounds or other Referrals:  None Maternal Substance Abuse:  No Significant Maternal Medications:  None Significant Maternal Lab Results:  None Other Comments:  None  Review of Systems  Eyes: Negative for blurred vision.  Gastrointestinal: Negative for abdominal pain.  Neurological: Negative for headaches.   History   Last menstrual period 10/24/2017. Maternal Exam:  Abdomen: Fetal presentation: vertex     Physical Exam  Cardiovascular: Normal rate and regular rhythm.  Respiratory: Effort normal and breath sounds normal.  GI: Soft.  Neurological: She has normal reflexes.    Cx cl/50/vtx in office 07/19/18  Prenatal labs: ABO, Rh:   Antibody:   Rubella:   RPR:    HBsAg:    HIV:    GBS:   positive2/14/20  Assessment/Plan: 33 yo G1P0 at term CHTN 2 stage IOIL GBBS prophylaxis   Teresa Curry II 07/22/2018, 10:14 AM

## 2018-07-23 ENCOUNTER — Other Ambulatory Visit (HOSPITAL_COMMUNITY): Payer: Self-pay | Admitting: *Deleted

## 2018-07-24 ENCOUNTER — Inpatient Hospital Stay (HOSPITAL_COMMUNITY)
Admission: AD | Admit: 2018-07-24 | Discharge: 2018-07-26 | DRG: 807 | Disposition: A | Discharge status: Home / Self Care | Payer: BLUE CROSS/BLUE SHIELD | Attending: Obstetrics and Gynecology | Admitting: Obstetrics and Gynecology

## 2018-07-24 ENCOUNTER — Inpatient Hospital Stay (HOSPITAL_COMMUNITY): Payer: BLUE CROSS/BLUE SHIELD | Admitting: Anesthesiology

## 2018-07-24 ENCOUNTER — Encounter (HOSPITAL_COMMUNITY): Payer: Self-pay | Admitting: *Deleted

## 2018-07-24 ENCOUNTER — Inpatient Hospital Stay (HOSPITAL_COMMUNITY): Payer: BLUE CROSS/BLUE SHIELD

## 2018-07-24 ENCOUNTER — Other Ambulatory Visit: Payer: Self-pay

## 2018-07-24 DIAGNOSIS — O99824 Streptococcus B carrier state complicating childbirth: Secondary | ICD-10-CM | POA: Diagnosis present

## 2018-07-24 DIAGNOSIS — Z3A39 39 weeks gestation of pregnancy: Secondary | ICD-10-CM

## 2018-07-24 DIAGNOSIS — Z23 Encounter for immunization: Secondary | ICD-10-CM

## 2018-07-24 DIAGNOSIS — O1002 Pre-existing essential hypertension complicating childbirth: Secondary | ICD-10-CM | POA: Diagnosis present

## 2018-07-24 DIAGNOSIS — O10919 Unspecified pre-existing hypertension complicating pregnancy, unspecified trimester: Secondary | ICD-10-CM | POA: Diagnosis present

## 2018-07-24 DIAGNOSIS — O99214 Obesity complicating childbirth: Secondary | ICD-10-CM | POA: Diagnosis present

## 2018-07-24 DIAGNOSIS — O34219 Maternal care for unspecified type scar from previous cesarean delivery: Secondary | ICD-10-CM | POA: Diagnosis present

## 2018-07-24 LAB — COMPREHENSIVE METABOLIC PANEL
ALT: 16 U/L (ref 0–44)
AST: 36 U/L (ref 15–41)
Albumin: 2.4 g/dL — ABNORMAL LOW (ref 3.5–5.0)
Alkaline Phosphatase: 136 U/L — ABNORMAL HIGH (ref 38–126)
Anion gap: 10 (ref 5–15)
BUN: 10 mg/dL (ref 6–20)
CO2: 19 mmol/L — ABNORMAL LOW (ref 22–32)
Calcium: 9.2 mg/dL (ref 8.9–10.3)
Chloride: 106 mmol/L (ref 98–111)
Creatinine, Ser: 0.71 mg/dL (ref 0.44–1.00)
GFR calc Af Amer: >60 mL/min (ref >60)
GFR calc non Af Amer: >60 mL/min (ref >60)
Glucose, Bld: 122 mg/dL — ABNORMAL HIGH (ref 70–99)
Potassium: 3.9 mmol/L (ref 3.5–5.1)
Sodium: 135 mmol/L (ref 135–145)
Total Bilirubin: 0.7 mg/dL (ref 0.3–1.2)
Total Protein: 5.3 g/dL — ABNORMAL LOW (ref 6.5–8.1)

## 2018-07-24 LAB — CBC
HCT: 34.1 % — ABNORMAL LOW (ref 36.0–46.0)
HCT: 33.8 % — ABNORMAL LOW (ref 36.0–46.0)
HEMOGLOBIN: 10.9 g/dL — AB (ref 12.0–15.0)
HEMOGLOBIN: 11 g/dL — AB (ref 12.0–15.0)
MCH: 27.2 pg (ref 26.0–34.0)
MCH: 26.8 pg (ref 26.0–34.0)
MCHC: 32.3 g/dL (ref 30.0–36.0)
MCHC: 32.2 g/dL (ref 30.0–36.0)
MCV: 84.4 fL (ref 80.0–100.0)
MCV: 83.3 fL (ref 80.0–100.0)
PLATELETS: 229 10*3/uL (ref 150–400)
Platelets: 232 10*3/uL (ref 150–400)
RBC: 4.04 MIL/uL (ref 3.87–5.11)
RBC: 4.06 MIL/uL (ref 3.87–5.11)
RDW: 14.8 % (ref 11.5–15.5)
RDW: 15 % (ref 11.5–15.5)
WBC: 13.5 10*3/uL — ABNORMAL HIGH (ref 4.0–10.5)
WBC: 13.5 10*3/uL — ABNORMAL HIGH (ref 4.0–10.5)
nRBC: 0 % (ref 0.0–0.2)
nRBC: 0 % (ref 0.0–0.2)

## 2018-07-24 LAB — ABO/RH: ABO/RH(D): O POS

## 2018-07-24 LAB — RPR: RPR: NONREACTIVE

## 2018-07-24 LAB — TYPE AND SCREEN
ABO/RH(D): O POS
Antibody Screen: NEGATIVE

## 2018-07-24 MED ORDER — TERBUTALINE SULFATE 1 MG/ML IJ SOLN: 0.2500 mg | Freq: Once | INTRAMUSCULAR | Status: DC | PRN

## 2018-07-24 MED ORDER — FENTANYL-BUPIVACAINE-NACL 0.5-0.125-0.9 MG/250ML-% EP SOLN
12.0000 mL/h | EPIDURAL | Status: DC | PRN
  Filled 2018-07-24: qty 250

## 2018-07-24 MED ORDER — SODIUM CHLORIDE (PF) 0.9 % IJ SOLN
INTRAMUSCULAR | Status: DC | PRN
  Administered 2018-07-24: 12 mL/h via EPIDURAL

## 2018-07-24 MED ORDER — BUTORPHANOL TARTRATE 1 MG/ML IJ SOLN
1.0000 mg | INTRAMUSCULAR | Status: DC | PRN
  Administered 2018-07-24: 1 mg via INTRAVENOUS
  Filled 2018-07-24: qty 1

## 2018-07-24 MED ORDER — LIDOCAINE HCL (PF) 1 % IJ SOLN
INTRAMUSCULAR | Status: DC | PRN
  Administered 2018-07-24: 5 mL via EPIDURAL

## 2018-07-24 MED ORDER — EPHEDRINE 5 MG/ML INJ: 10.0000 mg | INTRAVENOUS | Status: DC | PRN

## 2018-07-24 MED ORDER — LACTATED RINGERS IV SOLN
500.0000 mL | INTRAVENOUS | Status: DC | PRN
  Administered 2018-07-24: 1000 mL via INTRAVENOUS

## 2018-07-24 MED ORDER — ONDANSETRON HCL 4 MG/2ML IJ SOLN
4.0000 mg | Freq: Four times a day (QID) | INTRAMUSCULAR | Status: DC | PRN
  Administered 2018-07-24 (×3): 4 mg via INTRAVENOUS
  Filled 2018-07-24 (×3): qty 2

## 2018-07-24 MED ORDER — PHENYLEPHRINE 40 MCG/ML (10ML) SYRINGE FOR IV PUSH (FOR BLOOD PRESSURE SUPPORT): 80.0000 ug | PREFILLED_SYRINGE | INTRAVENOUS | Status: DC | PRN

## 2018-07-24 MED ORDER — SODIUM CHLORIDE 0.9 % IV SOLN
3.0000 g | Freq: Four times a day (QID) | INTRAVENOUS | Status: DC
  Administered 2018-07-24: 3 g via INTRAVENOUS
  Filled 2018-07-24 (×4): qty 3

## 2018-07-24 MED ORDER — ZOLPIDEM TARTRATE 5 MG PO TABS
5.0000 mg | ORAL_TABLET | Freq: Every evening | ORAL | Status: DC | PRN
  Administered 2018-07-24: 5 mg via ORAL
  Filled 2018-07-24: qty 1

## 2018-07-24 MED ORDER — MISOPROSTOL 25 MCG QUARTER TABLET
25.0000 ug | ORAL_TABLET | ORAL | Status: DC | PRN
  Administered 2018-07-24 (×2): 25 ug via VAGINAL
  Filled 2018-07-24 (×2): qty 1

## 2018-07-24 MED ORDER — OXYTOCIN 40 UNITS IN NORMAL SALINE INFUSION - SIMPLE MED: 2.5000 [IU]/h | INTRAVENOUS | Status: DC

## 2018-07-24 MED ORDER — LACTATED RINGERS IV SOLN
INTRAVENOUS | Status: DC
  Administered 2018-07-24 – 2018-07-25 (×3): via INTRAVENOUS

## 2018-07-24 MED ORDER — FENTANYL-BUPIVACAINE-NACL 0.5-0.125-0.9 MG/250ML-% EP SOLN: 12.0000 mL/h | EPIDURAL | Status: DC | PRN

## 2018-07-24 MED ORDER — PENICILLIN G 3 MILLION UNITS IVPB - SIMPLE MED: 3.0000 10*6.[IU] | INTRAVENOUS | Status: DC

## 2018-07-24 MED ORDER — SOD CITRATE-CITRIC ACID 500-334 MG/5ML PO SOLN: 30.0000 mL | ORAL | Status: DC | PRN

## 2018-07-24 MED ORDER — HYDROXYZINE HCL 50 MG PO TABS
50.0000 mg | ORAL_TABLET | Freq: Four times a day (QID) | ORAL | Status: DC | PRN
  Administered 2018-07-24 (×2): 50 mg via ORAL
  Filled 2018-07-24 (×2): qty 1

## 2018-07-24 MED ORDER — SODIUM CHLORIDE 0.9 % IV SOLN
5.0000 10*6.[IU] | Freq: Once | INTRAVENOUS | Status: AC
  Administered 2018-07-24: 5 10*6.[IU] via INTRAVENOUS
  Filled 2018-07-24: qty 5

## 2018-07-24 MED ORDER — SODIUM CHLORIDE 0.9 % IV SOLN: 5.0000 10*6.[IU] | Freq: Once | INTRAVENOUS | Status: DC

## 2018-07-24 MED ORDER — DIPHENHYDRAMINE HCL 50 MG/ML IJ SOLN: 12.5000 mg | INTRAMUSCULAR | Status: DC | PRN

## 2018-07-24 MED ORDER — FLEET ENEMA 7-19 GM/118ML RE ENEM: 1.0000 | ENEMA | RECTAL | Status: DC | PRN

## 2018-07-24 MED ORDER — PENICILLIN G 3 MILLION UNITS IVPB - SIMPLE MED
3.0000 10*6.[IU] | INTRAVENOUS | Status: DC
  Administered 2018-07-24 (×3): 3 10*6.[IU] via INTRAVENOUS
  Filled 2018-07-24 (×3): qty 100

## 2018-07-24 MED ORDER — LACTATED RINGERS IV SOLN
500.0000 mL | Freq: Once | INTRAVENOUS | Status: AC
  Administered 2018-07-24: 500 mL via INTRAVENOUS

## 2018-07-24 MED ORDER — ACETAMINOPHEN 325 MG PO TABS
650.0000 mg | ORAL_TABLET | ORAL | Status: DC | PRN
  Administered 2018-07-24 (×2): 650 mg via ORAL
  Filled 2018-07-24 (×3): qty 2

## 2018-07-24 MED ORDER — LIDOCAINE HCL (PF) 1 % IJ SOLN
30.0000 mL | INTRAMUSCULAR | Status: AC | PRN
  Administered 2018-07-25: 30 mL via SUBCUTANEOUS

## 2018-07-24 MED ORDER — OXYTOCIN BOLUS FROM INFUSION
500.0000 mL | Freq: Once | INTRAVENOUS | Status: AC
  Administered 2018-07-25: 500 mL via INTRAVENOUS

## 2018-07-24 MED ORDER — OXYTOCIN 40 UNITS IN NORMAL SALINE INFUSION - SIMPLE MED
1.0000 m[IU]/min | INTRAVENOUS | Status: DC
  Administered 2018-07-24: 2 m[IU]/min via INTRAVENOUS
  Filled 2018-07-24: qty 1000

## 2018-07-24 MED ORDER — LACTATED RINGERS IV SOLN: 500.0000 mL | Freq: Once | INTRAVENOUS | Status: DC

## 2018-07-24 MED ORDER — PHENYLEPHRINE 40 MCG/ML (10ML) SYRINGE FOR IV PUSH (FOR BLOOD PRESSURE SUPPORT)
80.0000 ug | PREFILLED_SYRINGE | INTRAVENOUS | Status: DC | PRN
  Filled 2018-07-24: qty 10

## 2018-07-24 NOTE — Progress Notes (Signed)
FHT cat one Cx 3-4/C/-3 UCs MVU 180-230 D/W patient Will check in about 1 hour

## 2018-07-24 NOTE — Anesthesia Pain Management Evaluation Note (Signed)
  CRNA Pain Management Visit Note  Patient: Teresa Curry, 33 y.o., female  "Hello I am a member of the anesthesia team at [facility]. We have an anesthesia team available at all times to provide care throughout the hospital, including epidural management and anesthesia for C-section. I don't know your plan for the delivery whether it a natural birth, water birth, IV sedation, nitrous supplementation, doula or epidural, but we want to meet your pain goals."   1.Was your pain managed to your expectations on prior hospitalizations?   No prior hospitalizations  2.What is your expectation for pain management during this hospitalization?     Epidural and IV pain meds  3.How can we help you reach that goal?   Record the patient's initial score and the patient's pain goal.   Pain: 8  Pain Goal: 4 [facility] wants you to be able to say your pain was always managed very well.  Laban Emperor 07/24/2018

## 2018-07-24 NOTE — Anesthesia Preprocedure Evaluation (Addendum)
Anesthesia Evaluation  Patient identified by MRN, date of birth, ID band Patient awake    Reviewed: Allergy & Precautions, NPO status , Patient's Chart, lab work & pertinent test results  Airway Mallampati: II  TM Distance: >3 FB Neck ROM: Full    Dental no notable dental hx. (+) Teeth Intact   Pulmonary    Pulmonary exam normal breath sounds clear to auscultation       Cardiovascular hypertension, negative cardio ROS Normal cardiovascular exam Rhythm:Regular Rate:Normal     Neuro/Psych PSYCHIATRIC DISORDERS Anxiety    GI/Hepatic negative GI ROS,   Endo/Other  Morbid obesity  Renal/GU Renal disease     Musculoskeletal   Abdominal   Peds  Hematology   Anesthesia Other Findings   Reproductive/Obstetrics (+) Pregnancy                             Lab Results  Component Value Date   WBC 13.5 (H) 07/24/2018   HGB 10.9 (L) 07/24/2018   HCT 33.8 (L) 07/24/2018   MCV 83.3 07/24/2018   PLT 232 07/24/2018    Anesthesia Physical Anesthesia Plan  ASA: II  Anesthesia Plan: Epidural   Post-op Pain Management:    Induction:   PONV Risk Score and Plan:   Airway Management Planned:   Additional Equipment:   Intra-op Plan:   Post-operative Plan:   Informed Consent: I have reviewed the patients History and Physical, chart, labs and discussed the procedure including the risks, benefits and alternatives for the proposed anesthesia with the patient or authorized representative who has indicated his/her understanding and acceptance.       Plan Discussed with:   Anesthesia Plan Comments:        Anesthesia Quick Evaluation

## 2018-07-24 NOTE — Anesthesia Procedure Notes (Signed)
Epidural Patient location during procedure: OB Start time: 07/24/2018 12:42 PM End time: 07/24/2018 12:56 PM  Staffing Anesthesiologist: Trevor Iha, MD Performed: anesthesiologist   Preanesthetic Checklist Completed: patient identified, site marked, surgical consent, pre-op evaluation, timeout performed, IV checked, risks and benefits discussed and monitors and equipment checked  Epidural Patient position: sitting Prep: site prepped and draped and DuraPrep Patient monitoring: continuous pulse ox and blood pressure Approach: midline Location: L3-L4 Injection technique: LOR air  Needle:  Needle type: Tuohy  Needle gauge: 17 G Needle length: 9 cm and 9 Needle insertion depth: 9 cm Catheter type: closed end flexible Catheter size: 19 Gauge Catheter at skin depth: 14 cm Test dose: negative  Assessment Events: blood not aspirated, injection not painful, no injection resistance, negative IV test and no paresthesia  Additional Notes Patient identified. Risks/Benefits/Options discussed with patient including but not limited to bleeding, infection, nerve damage, paralysis, failed block, incomplete pain control, headache, blood pressure changes, nausea, vomiting, reactions to medication both or allergic, itching and postpartum back pain. Confirmed with bedside nurse the patient's most recent platelet count. Confirmed with patient that they are not currently taking any anticoagulation, have any bleeding history or any family history of bleeding disorders. Patient expressed understanding and wished to proceed. All questions were answered. Sterile technique was used throughout the entire procedure. Please see nursing notes for vital signs. Test dose was given through epidural needle and negative prior to continuing to dose epidural or start infusion. Warning signs of high block given to the patient including shortness of breath, tingling/numbness in hands, complete motor block, or any  concerning symptoms with instructions to call for help. Patient was given instructions on fall risk and not to get out of bed. All questions and concerns addressed with instructions to call with any issues. 1 Attempt (S) . Patient tolerated procedure well.

## 2018-07-24 NOTE — Progress Notes (Signed)
Epidural in Cx 3/90/-3 IUPC placed FHT cat one, good response to scalp stim

## 2018-07-24 NOTE — Progress Notes (Signed)
FHT cat one Cx 5/C/-2 to -3

## 2018-07-24 NOTE — Progress Notes (Signed)
No changes to H&P per patient history No HA, no vision change, no epigastric pain  Vitals:   07/24/18 0123 07/24/18 0517  BP: (!) 153/84 (!) 141/77  Pulse: 100 (!) 104  Resp: 18 18  Temp:  98.1 F (36.7 C)   Lungs CTA Cor RRR Abd no epigastric tenderness DTR 1+  Results for orders placed or performed during the hospital encounter of 07/24/18 (from the past 24 hour(s))  CBC     Status: Abnormal   Collection Time: 07/24/18 12:58 AM  Result Value Ref Range   WBC 13.5 (H) 4.0 - 10.5 K/uL   RBC 4.04 3.87 - 5.11 MIL/uL   Hemoglobin 11.0 (L) 12.0 - 15.0 g/dL   HCT 51.7 (L) 61.6 - 07.3 %   MCV 84.4 80.0 - 100.0 fL   MCH 27.2 26.0 - 34.0 pg   MCHC 32.3 30.0 - 36.0 g/dL   RDW 71.0 62.6 - 94.8 %   Platelets 229 150 - 400 K/uL   nRBC 0.0 0.0 - 0.2 %  Type and screen [facility]     Status: None   Collection Time: 07/24/18 12:58 AM  Result Value Ref Range   ABO/RH(D) O POS    Antibody Screen NEG    Sample Expiration      07/27/2018 Performed at [facility], [address], [place], [place] [address]   Comprehensive metabolic panel     Status: Abnormal   Collection Time: 07/24/18 12:58 AM  Result Value Ref Range   Sodium 135 135 - 145 mmol/L   Potassium 3.9 3.5 - 5.1 mmol/L   Chloride 106 98 - 111 mmol/L   CO2 19 (L) 22 - 32 mmol/L   Glucose, Bld 122 (H) 70 - 99 mg/dL   BUN 10 6 - 20 mg/dL   Creatinine, Ser 0.35 0.44 - 1.00 mg/dL   Calcium 9.2 8.9 - 00.9 mg/dL   Total Protein 5.3 (L) 6.5 - 8.1 g/dL   Albumin 2.4 (L) 3.5 - 5.0 g/dL   AST 36 15 - 41 U/L   ALT 16 0 - 44 U/L   Alkaline Phosphatase 136 (H) 38 - 126 U/L   Total Bilirubin 0.7 0.3 - 1.2 mg/dL   GFR calc non Af Amer >60 >60 mL/min   GFR calc Af Amer >60 >60 mL/min   Anion gap 10 5 - 15  ABO/Rh     Status: None (Preliminary result)   Collection Time: 07/24/18 12:58 AM  Result Value Ref Range   ABO/RH(D)      O POS Performed at [facility], [address], [place], [place]  [address]     FHT cat one UCs irregular, mild  Last cytotec about 5:20 am  A/P:CHTN        No evidence of preeclamsia        Check cervix 2-3 hours

## 2018-07-24 NOTE — Progress Notes (Signed)
Temp 101.2 axillary FHT cat one Cx C/C/-2 A/P: D/C Ampicillin         Unasyn         Tylenol          Begin second stage           Check 1 hour

## 2018-07-24 NOTE — Progress Notes (Signed)
FHT cat one UCs MVU 180-190 Cx 8-9/C/-2/LOT

## 2018-07-25 LAB — CBC
HCT: 28.1 % — ABNORMAL LOW (ref 36.0–46.0)
HCT: 28.1 % — ABNORMAL LOW (ref 36.0–46.0)
Hemoglobin: 9.1 g/dL — ABNORMAL LOW (ref 12.0–15.0)
Hemoglobin: 9 g/dL — ABNORMAL LOW (ref 12.0–15.0)
MCH: 27.3 pg (ref 26.0–34.0)
MCH: 27.7 pg (ref 26.0–34.0)
MCHC: 32.4 g/dL (ref 30.0–36.0)
MCHC: 32 g/dL (ref 30.0–36.0)
MCV: 84.4 fL (ref 80.0–100.0)
MCV: 86.5 fL (ref 80.0–100.0)
Platelets: 196 10*3/uL (ref 150–400)
Platelets: 214 10*3/uL (ref 150–400)
RBC: 3.33 MIL/uL — ABNORMAL LOW (ref 3.87–5.11)
RBC: 3.25 MIL/uL — ABNORMAL LOW (ref 3.87–5.11)
RDW: 15 % (ref 11.5–15.5)
RDW: 15.4 % (ref 11.5–15.5)
WBC: 21.3 10*3/uL — ABNORMAL HIGH (ref 4.0–10.5)
WBC: 19.4 10*3/uL — ABNORMAL HIGH (ref 4.0–10.5)
nRBC: 0 % (ref 0.0–0.2)
nRBC: 0.1 % (ref 0.0–0.2)

## 2018-07-25 MED ORDER — WITCH HAZEL-GLYCERIN EX PADS: 1.0000 "application " | MEDICATED_PAD | CUTANEOUS | Status: DC | PRN

## 2018-07-25 MED ORDER — TRAMADOL HCL 50 MG PO TABS
ORAL_TABLET | ORAL | Status: AC
  Administered 2018-07-25: 50 mg via ORAL
  Filled 2018-07-25: qty 1

## 2018-07-25 MED ORDER — ZOLPIDEM TARTRATE 5 MG PO TABS: 5.0000 mg | ORAL_TABLET | Freq: Every evening | ORAL | Status: DC | PRN

## 2018-07-25 MED ORDER — BENZOCAINE-MENTHOL 20-0.5 % EX AERO
1.0000 "application " | INHALATION_SPRAY | CUTANEOUS | Status: DC | PRN
  Administered 2018-07-25: 1 via TOPICAL
  Filled 2018-07-25: qty 56

## 2018-07-25 MED ORDER — ONDANSETRON HCL 4 MG/2ML IJ SOLN: 4.0000 mg | INTRAMUSCULAR | Status: DC | PRN

## 2018-07-25 MED ORDER — ONDANSETRON HCL 4 MG PO TABS: 4.0000 mg | ORAL_TABLET | ORAL | Status: DC | PRN

## 2018-07-25 MED ORDER — IBUPROFEN 600 MG PO TABS
ORAL_TABLET | ORAL | Status: AC
  Administered 2018-07-25: 600 mg via ORAL
  Filled 2018-07-25: qty 1

## 2018-07-25 MED ORDER — COCONUT OIL OIL: 1.0000 "application " | TOPICAL_OIL | Status: DC | PRN

## 2018-07-25 MED ORDER — SODIUM CHLORIDE 0.9 % IV SOLN
3.0000 g | Freq: Four times a day (QID) | INTRAVENOUS | Status: DC
  Administered 2018-07-25 – 2018-07-26 (×5): 3 g via INTRAVENOUS
  Filled 2018-07-25 (×11): qty 3

## 2018-07-25 MED ORDER — TETANUS-DIPHTH-ACELL PERTUSSIS 5-2.5-18.5 LF-MCG/0.5 IM SUSP: 0.5000 mL | Freq: Once | INTRAMUSCULAR | Status: DC

## 2018-07-25 MED ORDER — TRAMADOL HCL 50 MG PO TABS
50.0000 mg | ORAL_TABLET | Freq: Four times a day (QID) | ORAL | Status: DC | PRN
  Administered 2018-07-25 (×3): 50 mg via ORAL
  Filled 2018-07-25 (×2): qty 1

## 2018-07-25 MED ORDER — IBUPROFEN 600 MG PO TABS
600.0000 mg | ORAL_TABLET | Freq: Four times a day (QID) | ORAL | Status: DC
  Administered 2018-07-25 – 2018-07-26 (×6): 600 mg via ORAL
  Filled 2018-07-25 (×5): qty 1

## 2018-07-25 MED ORDER — ACETAMINOPHEN 325 MG PO TABS
650.0000 mg | ORAL_TABLET | ORAL | Status: DC | PRN
  Administered 2018-07-25: 650 mg via ORAL

## 2018-07-25 MED ORDER — DIPHENHYDRAMINE HCL 25 MG PO CAPS: 25.0000 mg | ORAL_CAPSULE | Freq: Four times a day (QID) | ORAL | Status: DC | PRN

## 2018-07-25 MED ORDER — SIMETHICONE 80 MG PO CHEW: 80.0000 mg | CHEWABLE_TABLET | ORAL | Status: DC | PRN

## 2018-07-25 MED ORDER — LACTATED RINGERS IV SOLN
INTRAVENOUS | Status: DC
  Administered 2018-07-25: 08:00:00 via INTRAVENOUS

## 2018-07-25 MED ORDER — DIBUCAINE 1 % RE OINT: 1.0000 "application " | TOPICAL_OINTMENT | RECTAL | Status: DC | PRN

## 2018-07-25 MED ORDER — PRENATAL MULTIVITAMIN CH
1.0000 | ORAL_TABLET | Freq: Every day | ORAL | Status: DC
  Administered 2018-07-25 – 2018-07-26 (×2): 1 via ORAL
  Filled 2018-07-25 (×2): qty 1

## 2018-07-25 MED ORDER — INFLUENZA VAC SPLIT QUAD 0.5 ML IM SUSY
0.5000 mL | PREFILLED_SYRINGE | INTRAMUSCULAR | Status: AC
  Administered 2018-07-26: 0.5 mL via INTRAMUSCULAR
  Filled 2018-07-25: qty 0.5

## 2018-07-25 MED ORDER — SENNOSIDES-DOCUSATE SODIUM 8.6-50 MG PO TABS
2.0000 | ORAL_TABLET | ORAL | Status: DC
  Administered 2018-07-25: 2 via ORAL
  Filled 2018-07-25: qty 2

## 2018-07-25 NOTE — Progress Notes (Signed)
Post Partum Day 1 Subjective: no complaints, up ad lib, voiding and tolerating PO.  No HA, CP/SOB, RUQ pain or visual disturbance.  Objective: Blood pressure (!) 146/87, pulse (!) 113, temperature 99 F (37.2 C), temperature source Oral, resp. rate 18, height 5\' 3"  (1.6 m), weight 127 kg, last menstrual period 10/24/2017, SpO2 98 %.  Physical Exam:  General: alert, cooperative and appears stated age Lochia: appropriate Uterine Fundus: firm Incision: healing well, no significant drainage, no dehiscence DVT Evaluation: No evidence of DVT seen on physical exam. Negative Homan's sign. No cords or calf tenderness. 1+DTRs and 1+edema bilaterally  Recent Labs    07/25/18 0304 07/25/18 0643  HGB 9.1* 9.0*  HCT 28.1* 28.1*    Assessment/Plan: Plan for discharge tomorrow and Breastfeeding  CHTN-no medications required.  Blood pressures have been normal to mild range.  No symptoms.  Will continue to monitor.     LOS: 1 day   Mitchel Honour 07/25/2018, 10:13 AM

## 2018-07-25 NOTE — Consult Note (Signed)
Delivery team called to attend vacuum-assist delivery.  Team excused by Dr. Henderson Cloud after infant came out crying vigorously.   Overton Mam, MD (Attending Neonatologist)

## 2018-07-25 NOTE — Progress Notes (Signed)
Operative Delivery Note At 1:00 AM a viable female was delivered via VBAC, Spontaneous.  Presentation: vertex; Position: Left,, Occiput,, Anterior; Station: +3.  Verbal consent: obtained from patient.  Risks and benefits discussed in detail.  Risks include, but are not limited to the risks of anesthesia, bleeding, infection, damage to maternal tissues, fetal cephalhematoma.  There is also the risk of inability to effect vaginal delivery of the head, or shoulder dystocia that cannot be resolved by established maneuvers, leading to the need for emergency cesarean section.  Pushing well, tired, requests help. Kiwi x 1 UC , easy traction Delivery of head with mild shoulder dystocia. Mcrobert's position and suprapubic pressure, normal traction. Less than 1 minute.  APGAR: 8, 9; weight  .   Placenta status:intact  ,to path .   Cord:  3 vessels with the following complications:nuchal cord x 2 .  Cord pH: pending  Anesthesia: 1% lidocaine local  Instruments: Kiwi Episiotomy: None Lacerations: 2nd degree ML lac repaired Suture Repair: 2.0 3.0 vicryl rapide Est. Blood Loss (mL):  Per nurse note Mom to postpartum.  Baby to Couplet care / Skin to Skin   Post partum Unasyn for chorioamnionitis.  [name] II 07/25/2018, 1:23 AM

## 2018-07-25 NOTE — Lactation Note (Signed)
This note was copied from a baby's chart. Lactation Consultation Note  Patient Name: Teresa Curry Date: 07/25/2018 Reason for consult: Initial assessment;Primapara;1st time breastfeeding;Term  Visited with P1 Mom of term baby at 15 hrs.  Mom choosing to pump and bottle feed baby.  Declined using Symphony DEBP due to cost, has a Spectra DEBP at home.  Mom would rather use a hand pump and hand expression until she gets home.  Mom made aware of the importance of early hand expression and double pump with regards to milk supply.  Mom still prefers to wait until she gets home.  Encouraged keeping baby STS, and expressing her breasts every 2-3 hrs.  Offered to assist prn.  Lactation brochure left in room.  Mom made aware of IP and OP lactation support available to her.     Interventions Interventions: Breast feeding basics reviewed;Skin to skin;Breast massage;Hand express;Hand pump  Lactation Tools Discussed/Used Tools: Pump Breast pump type: Manual Pump Review: Setup, frequency, and cleaning;Milk Storage Initiated by:: RN Date initiated:: 07/24/18   Consult Status Consult Status: Follow-up Date: 07/26/18 Follow-up type: In-patient    Reisha, [place] 07/25/2018, 4:46 PM

## 2018-07-25 NOTE — Anesthesia Postprocedure Evaluation (Signed)
Anesthesia Post Note  Patient: Teresa Curry  Procedure(s) Performed: AN AD HOC LABOR EPIDURAL     Patient location during evaluation: Mother Baby Anesthesia Type: Epidural Level of consciousness: awake and alert and oriented Pain management: satisfactory to patient Vital Signs Assessment: post-procedure vital signs reviewed and stable Respiratory status: respiratory function stable Cardiovascular status: stable Postop Assessment: no headache, no backache, epidural receding, patient able to bend at knees, no signs of nausea or vomiting and adequate PO intake Anesthetic complications: no    Last Vitals:  Vitals:   07/25/18 0350 07/25/18 0450  BP: (!) 143/83 (!) 146/87  Pulse: (!) 116 (!) 113  Resp: 18 18  Temp: 37.2 C 37.2 C  SpO2: 99% 98%    Last Pain:  Vitals:   07/25/18 0450  TempSrc: Oral  PainSc: 9    Pain Goal: Patients Stated Pain Goal: 4 (07/24/18 1138)                 Fathima Bartl

## 2018-07-26 ENCOUNTER — Encounter (HOSPITAL_COMMUNITY): Payer: Self-pay

## 2018-07-26 NOTE — Addendum Note (Signed)
Addendum  created 07/26/18 1136 by Lowella Curb, MD   Clinical Note Signed

## 2018-07-26 NOTE — Lactation Note (Signed)
This note was copied from a baby's chart. Lactation Consultation Note  Patient Name: Girl Cinde Funnell [id number] Date: 07/26/2018 Reason for consult: Follow-up assessment;Infant weight loss  Baby is 36 hours old  Per mom plans to pump and bottle feed and when she gets home today  Will pump with her own DEBP Spectra1 .  LC reviewed supply and demand and the importance for pumping both breast  For 15 -20 mins at the same time at least 8-10 x's a day. ( within that pumping  Consider a 60 mins power pumping  ( 10 min on off for 60 mins or 20 mins on 10 mins off  Over 60 mins.  Sore nipple and engorgement prevention and tx reviewed.  LC also enc mom to hand express prior to pumping and after pumping to enhance let down/ and  To increase milk supply.  LC reassured mom it is a slow process and to be consistent with pumping around the clock.  Mother informed of post-discharge support and given phone number to the lactation department, including services for phone call assistance; out-patient appointments; and breastfeeding support group. List of other breastfeeding resources in the community given in the handout. Encouraged mother to call for problems or concerns related to breastfeeding.   Maternal Data Has patient been taught Hand Expression?: Yes  Feeding Feeding Type: Bottle Fed - Formula Nipple Type: Slow - flow  LATCH Score                   Interventions Interventions: Breast feeding basics reviewed  Lactation Tools Discussed/Used Tools: Pump Breast pump type: Manual Pump Review: Milk Storage Initiated by:: MAI / revieweed -    Consult Status Consult Status: Complete Date: 07/26/18    Kathrin Greathouse 07/26/2018, 2:19 PM

## 2018-07-26 NOTE — Discharge Summary (Signed)
Obstetric Discharge Summary Reason for Admission: induction of labor and cHTN - no meds needed Prenatal Procedures: none Intrapartum Procedures: vacuum Postpartum Procedures: none Complications-Operative and Postpartum: 2nd degree perineal laceration and shoulder dystocia <1 min resolved with McRoberts and suprapubic pressure Hemoglobin  Date Value Ref Range Status  07/25/2018 9.0 (L) 12.0 - 15.0 g/dL Final    Comment:    REPEATED TO VERIFY   HCT  Date Value Ref Range Status  07/25/2018 28.1 (L) 36.0 - 46.0 % Final    Physical Exam:  General: alert, cooperative and appears stated age 33: appropriate Uterine Fundus: firm Incision: healing well, no significant drainage, no dehiscence, no significant erythema DVT Evaluation: No evidence of DVT seen on physical exam. Negative Homan's sign. No cords or calf tenderness. No significant calf/ankle edema.  Discharge Diagnoses: Term Pregnancy-delivered  Discharge Information: Date: 07/26/2018 Activity: pelvic rest Diet: routine Medications: PNV Condition: stable Instructions: refer to practice specific booklet Discharge to: home   Newborn Data: Live born female  Birth Weight: 7 lb 7.2 oz (3380 g) APGAR: 8, 9  Newborn Delivery   Birth date/time:  07/25/2018 01:00:00 Delivery type:  Vaginal, Vacuum (Extractor)     Home with mother.  Ranae Pila 07/26/2018, 9:54 AM

## 2020-04-30 ENCOUNTER — Emergency Department
Admission: EM | Admit: 2020-04-30 | Discharge: 2020-04-30 | Disposition: A | Discharge status: Home / Self Care | Payer: BC Managed Care – PPO | Attending: Student in an Organized Health Care Education/Training Program | Admitting: Student in an Organized Health Care Education/Training Program

## 2020-04-30 ENCOUNTER — Encounter: Payer: Self-pay | Admitting: Emergency Medicine

## 2020-04-30 ENCOUNTER — Other Ambulatory Visit: Payer: Self-pay

## 2020-04-30 DIAGNOSIS — N898 Other specified noninflammatory disorders of vagina: Secondary | ICD-10-CM | POA: Diagnosis not present

## 2020-04-30 DIAGNOSIS — N949 Unspecified condition associated with female genital organs and menstrual cycle: Secondary | ICD-10-CM

## 2020-04-30 DIAGNOSIS — Z711 Person with feared health complaint in whom no diagnosis is made: Secondary | ICD-10-CM | POA: Diagnosis not present

## 2020-04-30 NOTE — ED Triage Notes (Signed)
Pt reports she has not been able to remove her tampon since yesterday. Denies any pain or discomfort. Talks in complete sentences no respiratory distress noted

## 2020-04-30 NOTE — ED Notes (Signed)
[place] and [place] Annice Pih to bedside

## 2020-04-30 NOTE — ED Provider Notes (Signed)
[facility] Emergency Department Provider Note  ____________________________________________   Event Date/Time   First MD Initiated Contact with Patient 04/30/20 2253     (approximate)  I have reviewed the triage vital signs and the nursing notes.   HISTORY  Chief Complaint Foreign Body in Vagina  HPI Teresa Curry is a 34 y.o. female reports to the emergency department for concern of a tampon left in her vagina.  The patient states that she last used 1 yesterday around noon, got busy while Christmas shopping and then was unable to find it when she got home.  She has been unable to find it since that time, and was concerned.  She had her husband try to find it but was unable to.  She denies any fever, abdominal pain or other constitutional symptoms.         Past Medical History:  Diagnosis Date  . ADHD   . Anxiety   . IBS (irritable bowel syndrome)   . Kidney stones     Patient Active Problem List   Diagnosis Date Noted  . Chronic hypertension affecting pregnancy 07/24/2018  . Encounter for general adult medical examination with abnormal findings 04/13/2017    Past Surgical History:  Procedure Laterality Date  . WISDOM TOOTH EXTRACTION      Prior to Admission medications   Medication Sig Start Date End Date Taking? Authorizing Provider  acetaminophen (TYLENOL) 500 MG tablet Take 1,000 mg by mouth every 6 (six) hours as needed for mild pain.    [provider]  Prenatal Vit-Fe Fumarate-FA (PRENATAL MULTIVITAMIN) TABS tablet Take 1 tablet by mouth at bedtime.     [provider]    Allergies Morphine and related and Vicodin [hydrocodone-acetaminophen]  Family History  Problem Relation Age of Onset  . Cancer Mother   . Diabetes Father   . Cancer Maternal Grandmother   . Heart disease Paternal Grandfather     Social History Social History   Tobacco Use  . Smoking status: Never Smoker  . Smokeless tobacco: Never  Used  Vaping Use  . Vaping Use: Never used  Substance Use Topics  . Alcohol use: Yes  . Drug use: No    Review of Systems  Constitutional: No fever/chills Eyes: No visual changes. ENT: No sore throat. Cardiovascular: Denies chest pain. Respiratory: Denies shortness of breath. Gastrointestinal: No abdominal pain.  No nausea, no vomiting.  No diarrhea.  No constipation. Genitourinary: + Possible remaining foreign body, negative for dysuria. Musculoskeletal: Negative for back pain. Skin: Negative for rash. Neurological: Negative for headaches, focal weakness or numbness.   ____________________________________________   PHYSICAL EXAM:  VITAL SIGNS: ED Triage Vitals  Enc Vitals Group     BP 04/30/20 2237 (!) 151/107     Pulse Rate 04/30/20 2237 97     Resp 04/30/20 2237 20     Temp 04/30/20 2237 97.7 F (36.5 C)     Temp Source 04/30/20 2237 Oral     SpO2 04/30/20 2237 99 %     Weight 04/30/20 2239 250 lb (113.4 kg)     Height 04/30/20 2239 5\' 3"  (1.6 m)     Head Circumference --      Peak Flow --      Pain Score 04/30/20 2239 0     Pain Loc --      Pain Edu? --      Excl. in GC? --     Constitutional: Alert and oriented. Well appearing  and in no acute distress. Eyes: Conjunctivae are normal. EOMI. Head: Atraumatic. Nose: No congestion/rhinnorhea. Neck: No stridor.   Cardiovascular: Normal rate, regular rhythm.   Good peripheral circulation. Respiratory: Normal respiratory effort.  No retractions.  Gastrointestinal: Soft and nontender. No distention. No abdominal bruits. No CVA tenderness. Genitourinary: Normal external female genitalia.  Speculum exam reveals no obvious foreign body, no tampon remaining in the vaginal canal.  Bimanual exam does not reveal any palpated foreign body.   Musculoskeletal: No lower extremity tenderness nor edema.  No joint effusions. Neurologic:  Normal speech and language. No gross focal neurologic deficits are appreciated. No gait  instability. Skin:  Skin is warm, dry and intact. No rash noted. Psychiatric: Mood and affect are normal. Speech and behavior are normal.   ____________________________________________   INITIAL IMPRESSION / ASSESSMENT AND PLAN / ED COURSE  As part of my medical decision making, I reviewed the following data within the electronic MEDICAL RECORD NUMBER Nursing notes reviewed and incorporated        Patient is a 34 year old female presents to emergency department for concern of possible tampon left since noon yesterday.  See HPI for further details.  On physical exam, no foreign body was able to be visualized or located in the vagina.  The patient was reassured and she expressed relief.  The patient is amenable and appropriate for outpatient follow-up at this time.      ____________________________________________   FINAL CLINICAL IMPRESSION(S) / ED DIAGNOSES  Final diagnoses:  Vaginal discomfort  Feared complaint without diagnosis     ED Discharge Orders    None      *Please note:  Teresa Curry was evaluated in Emergency Department on 04/30/2020 for the symptoms described in the history of present illness. She was evaluated in the context of the global COVID-19 pandemic, which necessitated consideration that the patient might be at risk for infection with the SARS-CoV-2 virus that causes COVID-19. Institutional protocols and algorithms that pertain to the evaluation of patients at risk for COVID-19 are in a state of rapid change based on information released by regulatory bodies including the CDC and federal and state organizations. These policies and algorithms were followed during the patient's care in the ED.  Some ED evaluations and interventions may be delayed as a result of limited staffing during and the pandemic.*   Note:  This document was prepared using Dragon voice recognition software and may include unintentional dictation errors. While   Lucy Chris,  [place] 04/30/20 2349    Willy Eddy, MD 05/01/20 [id number]

## 2020-12-21 DIAGNOSIS — N911 Secondary amenorrhea: Secondary | ICD-10-CM | POA: Diagnosis not present

## 2021-01-05 DIAGNOSIS — Z3685 Encounter for antenatal screening for Streptococcus B: Secondary | ICD-10-CM | POA: Diagnosis not present

## 2021-01-05 DIAGNOSIS — Z3481 Encounter for supervision of other normal pregnancy, first trimester: Secondary | ICD-10-CM | POA: Diagnosis not present

## 2021-01-05 LAB — OB RESULTS CONSOLE RUBELLA ANTIBODY, IGM: Rubella: IMMUNE

## 2021-01-05 LAB — OB RESULTS CONSOLE GC/CHLAMYDIA: Gonorrhea: NEGATIVE

## 2021-01-05 LAB — HEPATITIS C ANTIBODY: HCV Ab: NEGATIVE

## 2021-01-05 LAB — OB RESULTS CONSOLE ABO/RH: RH Type: POSITIVE

## 2021-01-05 LAB — OB RESULTS CONSOLE HIV ANTIBODY (ROUTINE TESTING): HIV: NONREACTIVE

## 2021-01-05 LAB — OB RESULTS CONSOLE ANTIBODY SCREEN: Antibody Screen: NEGATIVE

## 2021-01-05 LAB — OB RESULTS CONSOLE RPR: RPR: NONREACTIVE

## 2021-01-05 LAB — OB RESULTS CONSOLE HEPATITIS B SURFACE ANTIGEN: Hepatitis B Surface Ag: NEGATIVE

## 2021-01-07 ENCOUNTER — Other Ambulatory Visit: Payer: Self-pay

## 2021-01-07 ENCOUNTER — Inpatient Hospital Stay (HOSPITAL_COMMUNITY): Payer: BC Managed Care – PPO

## 2021-01-07 ENCOUNTER — Inpatient Hospital Stay (HOSPITAL_COMMUNITY)
Admission: AD | Admit: 2021-01-07 | Discharge: 2021-01-07 | Disposition: A | Discharge status: Home / Self Care | Payer: BC Managed Care – PPO | Attending: Obstetrics and Gynecology | Admitting: Obstetrics and Gynecology

## 2021-01-07 ENCOUNTER — Encounter (HOSPITAL_COMMUNITY): Payer: Self-pay | Admitting: Obstetrics and Gynecology

## 2021-01-07 DIAGNOSIS — R102 Pelvic and perineal pain: Secondary | ICD-10-CM

## 2021-01-07 DIAGNOSIS — O99611 Diseases of the digestive system complicating pregnancy, first trimester: Secondary | ICD-10-CM | POA: Diagnosis not present

## 2021-01-07 DIAGNOSIS — O3680X0 Pregnancy with inconclusive fetal viability, not applicable or unspecified: Secondary | ICD-10-CM

## 2021-01-07 DIAGNOSIS — Z87442 Personal history of urinary calculi: Secondary | ICD-10-CM | POA: Insufficient documentation

## 2021-01-07 DIAGNOSIS — O3481 Maternal care for other abnormalities of pelvic organs, first trimester: Secondary | ICD-10-CM | POA: Diagnosis not present

## 2021-01-07 DIAGNOSIS — Z3A09 9 weeks gestation of pregnancy: Secondary | ICD-10-CM

## 2021-01-07 DIAGNOSIS — Z3A08 8 weeks gestation of pregnancy: Secondary | ICD-10-CM | POA: Diagnosis not present

## 2021-01-07 DIAGNOSIS — K589 Irritable bowel syndrome without diarrhea: Secondary | ICD-10-CM | POA: Insufficient documentation

## 2021-01-07 DIAGNOSIS — O09521 Supervision of elderly multigravida, first trimester: Secondary | ICD-10-CM | POA: Insufficient documentation

## 2021-01-07 DIAGNOSIS — O26891 Other specified pregnancy related conditions, first trimester: Secondary | ICD-10-CM

## 2021-01-07 DIAGNOSIS — M549 Dorsalgia, unspecified: Secondary | ICD-10-CM | POA: Diagnosis not present

## 2021-01-07 DIAGNOSIS — N8311 Corpus luteum cyst of right ovary: Secondary | ICD-10-CM | POA: Diagnosis not present

## 2021-01-07 DIAGNOSIS — O99891 Other specified diseases and conditions complicating pregnancy: Secondary | ICD-10-CM | POA: Diagnosis not present

## 2021-01-07 DIAGNOSIS — Z885 Allergy status to narcotic agent status: Secondary | ICD-10-CM | POA: Diagnosis not present

## 2021-01-07 LAB — COMPREHENSIVE METABOLIC PANEL
ALT: 19 U/L (ref 0–44)
AST: 17 U/L (ref 15–41)
Albumin: 3.2 g/dL — ABNORMAL LOW (ref 3.5–5.0)
Alkaline Phosphatase: 74 U/L (ref 38–126)
Anion gap: 7 (ref 5–15)
BUN: 7 mg/dL (ref 6–20)
CO2: 23 mmol/L (ref 22–32)
Calcium: 8.8 mg/dL — ABNORMAL LOW (ref 8.9–10.3)
Chloride: 103 mmol/L (ref 98–111)
Creatinine, Ser: 0.71 mg/dL (ref 0.44–1.00)
GFR, Estimated: >60 mL/min (ref >60)
Glucose, Bld: 109 mg/dL — ABNORMAL HIGH (ref 70–99)
Potassium: 3.9 mmol/L (ref 3.5–5.1)
Sodium: 133 mmol/L — ABNORMAL LOW (ref 135–145)
Total Bilirubin: 0.3 mg/dL (ref 0.3–1.2)
Total Protein: 6.5 g/dL (ref 6.5–8.1)

## 2021-01-07 LAB — URINALYSIS, MICROSCOPIC (REFLEX): RBC / HPF: NONE SEEN RBC/hpf (ref 0–5)

## 2021-01-07 LAB — CBC
HCT: 38.4 % (ref 36.0–46.0)
Hemoglobin: 13.1 g/dL (ref 12.0–15.0)
MCH: 29.2 pg (ref 26.0–34.0)
MCHC: 34.1 g/dL (ref 30.0–36.0)
MCV: 85.7 fL (ref 80.0–100.0)
Platelets: 299 10*3/uL (ref 150–400)
RBC: 4.48 MIL/uL (ref 3.87–5.11)
RDW: 13 % (ref 11.5–15.5)
WBC: 11 10*3/uL — ABNORMAL HIGH (ref 4.0–10.5)
nRBC: 0 % (ref 0.0–0.2)

## 2021-01-07 LAB — WET PREP, GENITAL
Clue Cells Wet Prep HPF POC: NONE SEEN
Sperm: NONE SEEN
Trich, Wet Prep: NONE SEEN

## 2021-01-07 LAB — URINALYSIS, ROUTINE W REFLEX MICROSCOPIC
Bilirubin Urine: NEGATIVE
Glucose, UA: NEGATIVE mg/dL
Hgb urine dipstick: NEGATIVE
Ketones, ur: NEGATIVE mg/dL
Nitrite: NEGATIVE
Protein, ur: NEGATIVE mg/dL
Specific Gravity, Urine: >1.03 — ABNORMAL HIGH (ref 1.005–1.030)
pH: 6 (ref 5.0–8.0)

## 2021-01-07 LAB — HCG, QUANTITATIVE, PREGNANCY: hCG, Beta Chain, Quant, S: 100725 m[IU]/mL — ABNORMAL HIGH (ref <5)

## 2021-01-07 LAB — POCT PREGNANCY, URINE: Preg Test, Ur: POSITIVE — AB

## 2021-01-07 MED ORDER — CYCLOBENZAPRINE HCL 5 MG PO TABS
10.0000 mg | ORAL_TABLET | Freq: Once | ORAL | Status: AC
  Administered 2021-01-07: 10 mg via ORAL
  Filled 2021-01-07: qty 2

## 2021-01-07 MED ORDER — CYCLOBENZAPRINE HCL 10 MG PO TABS: 10.0000 mg | ORAL_TABLET | Freq: Two times a day (BID) | ORAL | 0 refills | Status: DC | PRN

## 2021-01-07 MED ORDER — HYOSCYAMINE SULFATE 0.125 MG SL SUBL
0.1250 mg | SUBLINGUAL_TABLET | Freq: Once | SUBLINGUAL | Status: AC
  Administered 2021-01-07: 0.125 mg via SUBLINGUAL
  Filled 2021-01-07: qty 1

## 2021-01-07 NOTE — MAU Note (Signed)
Pt reports back pain that started around 8:15 tonight. Took tylenol without much help. Denies any vag bleeding. Stated she is having some abd cramping but is not regular.

## 2021-01-07 NOTE — MAU Provider Note (Signed)
Chief Complaint: Back Pain   Event Date/Time   First Provider Initiated Contact with Patient 01/07/21 0155        SUBJECTIVE HPI: Teresa Curry is a 35 y.o. G2P1001 at [redacted]w[redacted]d by LMP who presents to maternity admissions reporting abdominal cramping and back pain since 8:15 tonight.  .States feels somewhat like kidney stones. Last had stone 5 yrs ago.  She denies vaginal bleeding, vaginal itching/burning, urinary symptoms, h/a, dizziness, n/v, or fever/chills.    Back Pain This is a new problem. The current episode started today. The problem is unchanged. The pain is present in the lumbar spine. The quality of the pain is described as cramping. Radiates to: right and left flank. Associated symptoms include abdominal pain and pelvic pain. Pertinent negatives include no chest pain, dysuria, fever, headaches, numbness or weakness. She has tried nothing for the symptoms.  Abdominal Pain This is a new problem. The current episode started today. The onset quality is sudden. The problem occurs intermittently. Pain location: middle abdomen down to pelvis. Associated symptoms include nausea and vomiting. Pertinent negatives include no constipation, diarrhea (always has loose stools with IBS), dysuria, fever, frequency or headaches. Nothing aggravates the pain. The pain is relieved by Nothing. She has tried nothing for the symptoms.   RN Note: Pt reports back pain that started around 8:15 tonight. Took tylenol without much help. Denies any vag bleeding. Stated she is having some abd cramping but is not regular  Past Medical History:  Diagnosis Date   ADHD    Anxiety    IBS (irritable bowel syndrome)    Kidney stones    Past Surgical History:  Procedure Laterality Date   WISDOM TOOTH EXTRACTION     Social History   Socioeconomic History   Marital status: Married    Spouse name: Not on file   Number of children: Not on file   Years of education: Not on file   Highest education level: Not on file   Occupational History   Not on file  Tobacco Use   Smoking status: Never   Smokeless tobacco: Never  Vaping Use   Vaping Use: Never used  Substance and Sexual Activity   Alcohol use: Yes   Drug use: No   Sexual activity: Yes    Birth control/protection: Condom, Pill  Other Topics Concern   Not on file  Social History Narrative   Not on file   Social Determinants of Health   Financial Resource Strain: Not on file  Food Insecurity: Not on file  Transportation Needs: Not on file  Physical Activity: Not on file  Stress: Not on file  Social Connections: Not on file  Intimate Partner Violence: Not on file   No current facility-administered medications on file prior to encounter.   Current Outpatient Medications on File Prior to Encounter  Medication Sig Dispense Refill   acetaminophen (TYLENOL) 500 MG tablet Take 1,000 mg by mouth every 6 (six) hours as needed for mild pain.     ondansetron (ZOFRAN-ODT) 4 MG disintegrating tablet Take 4 mg by mouth every 8 (eight) hours as needed for nausea or vomiting.     Prenatal Vit-Fe Fumarate-FA (PRENATAL MULTIVITAMIN) TABS tablet Take 1 tablet by mouth at bedtime.      Allergies  Allergen Reactions   Morphine And Related Itching and Nausea Only   Vicodin [Hydrocodone-Acetaminophen] Itching and Nausea Only    I have reviewed patient's Past Medical Hx, Surgical Hx, Family Hx, Social Hx, medications and allergies.  ROS:  Review of Systems  Constitutional:  Negative for fever.  Cardiovascular:  Negative for chest pain.  Gastrointestinal:  Positive for abdominal pain, nausea and vomiting. Negative for constipation and diarrhea (always has loose stools with IBS).  Genitourinary:  Positive for pelvic pain. Negative for dysuria and frequency.  Musculoskeletal:  Positive for back pain.  Neurological:  Negative for weakness, numbness and headaches.  Review of Systems  Other systems negative   Physical Exam  Physical Exam Patient  Vitals for the past 24 hrs:  BP Temp Pulse Resp Height  01/07/21 0128 (!) (P) 158/80 (P) 98.7 F (37.1 C) (P) 97 (P) 18 (P) 5\' 3"  (1.6 m)   Constitutional: Well-developed, well-nourished female in no acute distress.  Cardiovascular: normal rate Respiratory: normal effort GI: Abd soft, mildly tender  MS: Extremities nontender, no edema, normal ROM Neurologic: Alert and oriented x 4.  GU: Neg CVAT.  PELVIC EXAM: Cervix pink, visually closed, without lesion, scant white creamy discharge, vaginal walls and external genitalia normal Bimanual exam: Cervix 0/long/high, firm, anterior, neg CMT, uterus nontender, nonenlarged, adnexa without localized tenderness, enlargement, or mass   Pelvis is tender diffusely   LAB RESULTS Results for orders placed or performed during the hospital encounter of 01/07/21 (from the past 24 hour(s))  Pregnancy, urine POC     Status: Abnormal   Collection Time: 01/07/21  1:34 AM  Result Value Ref Range   Preg Test, Ur POSITIVE (A) NEGATIVE  Urinalysis, Routine w reflex microscopic     Status: Abnormal   Collection Time: 01/07/21  1:39 AM  Result Value Ref Range   Color, Urine YELLOW YELLOW   APPearance CLEAR CLEAR   Specific Gravity, Urine >1.030 (H) 1.005 - 1.030   pH 6.0 5.0 - 8.0   Glucose, UA NEGATIVE NEGATIVE mg/dL   Hgb urine dipstick NEGATIVE NEGATIVE   Bilirubin Urine NEGATIVE NEGATIVE   Ketones, ur NEGATIVE NEGATIVE mg/dL   Protein, ur NEGATIVE NEGATIVE mg/dL   Nitrite NEGATIVE NEGATIVE   Leukocytes,Ua SMALL (A) NEGATIVE  Urinalysis, Microscopic (reflex)     Status: Abnormal   Collection Time: 01/07/21  1:39 AM  Result Value Ref Range   RBC / HPF NONE SEEN 0 - 5 RBC/hpf   WBC, UA 6-10 0 - 5 WBC/hpf   Bacteria, UA RARE (A) NONE SEEN   Squamous Epithelial / LPF 0-5 0 - 5  Wet prep, genital     Status: Abnormal   Collection Time: 01/07/21  1:45 AM  Result Value Ref Range   Yeast Wet Prep HPF POC PRESENT (A) NONE SEEN   Trich, Wet Prep NONE  SEEN NONE SEEN   Clue Cells Wet Prep HPF POC NONE SEEN NONE SEEN   WBC, Wet Prep HPF POC MANY (A) NONE SEEN   Sperm NONE SEEN   CBC     Status: Abnormal   Collection Time: 01/07/21  2:10 AM  Result Value Ref Range   WBC 11.0 (H) 4.0 - 10.5 K/uL   RBC 4.48 3.87 - 5.11 MIL/uL   Hemoglobin 13.1 12.0 - 15.0 g/dL   HCT 01/09/21 08.6 - 57.8 %   MCV 85.7 80.0 - 100.0 fL   MCH 29.2 26.0 - 34.0 pg   MCHC 34.1 30.0 - 36.0 g/dL   RDW 46.9 62.9 - 52.8 %   Platelets 299 150 - 400 K/uL   nRBC 0.0 0.0 - 0.2 %  Comprehensive metabolic panel     Status: Abnormal   Collection Time: 01/07/21  2:10 AM  Result Value Ref Range   Sodium 133 (L) 135 - 145 mmol/L   Potassium 3.9 3.5 - 5.1 mmol/L   Chloride 103 98 - 111 mmol/L   CO2 23 22 - 32 mmol/L   Glucose, Bld 109 (H) 70 - 99 mg/dL   BUN 7 6 - 20 mg/dL   Creatinine, Ser 0.62 0.44 - 1.00 mg/dL   Calcium 8.8 (L) 8.9 - 10.3 mg/dL   Total Protein 6.5 6.5 - 8.1 g/dL   Albumin 3.2 (L) 3.5 - 5.0 g/dL   AST 17 15 - 41 U/L   ALT 19 0 - 44 U/L   Alkaline Phosphatase 74 38 - 126 U/L   Total Bilirubin 0.3 0.3 - 1.2 mg/dL   GFR, Estimated >69 >48 mL/min   Anion gap 7 5 - 15  hCG, quantitative, pregnancy     Status: Abnormal   Collection Time: 01/07/21  2:10 AM  Result Value Ref Range   hCG, Beta Chain, Quant, S 100,725 (H) <5 mIU/mL   IMAGING US OB Comp Less 14 Wks  Result Date: 01/07/2021 CLINICAL DATA:  Initial evaluation for acute back pain, early pregnancy. EXAM: OBSTETRIC <14 WK ULTRASOUND TECHNIQUE: Transabdominal ultrasound was performed for evaluation of the gestation as well as the maternal uterus and adnexal regions. COMPARISON:  None. FINDINGS: Intrauterine gestational sac: Single Yolk sac:  Present Embryo:  Present Cardiac Activity: Present Heart Rate: 167 bpm CRL:   21.1 mm   8 w 5 d                  [place] EDC: 08/14/2021 Subchorionic hemorrhage:  None visualized. Maternal uterus/adnexae: Ovaries within normal limits. Corpus luteal cyst noted on the  right. No adnexal mass or free fluid. IMPRESSION: 1. Single viable intrauterine pregnancy, estimated gestational age [redacted] weeks and 5 days, with ultrasound EDC of 08/14/2021. No complication. 2. Degenerating right ovarian corpus luteal cyst. 3. No other acute maternal uterine or adnexal abnormality. Electronically Signed   By: Rise Mu M.D.   On: 01/07/2021 02:53     MAU Management/MDM: Ordered usual first trimester r/o ectopic labs.   Pelvic exam and cultures done Will check baseline Ultrasound to rule out ectopic.  This bleeding/pain can represent a normal pregnancy with bleeding, spontaneous abortion or even an ectopic which can be life-threatening.  The process as listed above helps to determine which of these is present.  Reviewed fetus looks good, therefore ruling out ectopic Tried dose of Levsin to see if maybe pain was gastrointestinal colic, as pt has hx of IBS-D This did not help pain much  We gave her a dose of Flexeril and her pain completely resolved Discussed this is probably a back spasm the wrapped around to the front.    ASSESSMENT 1. Back pain affecting pregnancy   2. Pelvic pain affecting pregnancy in first trimester, antepartum     PLAN Discharge home Rx Flexeril limited # for prn use at home Advised to followup with office  Pt stable at time of discharge. Encouraged to return here if she develops worsening of symptoms, increase in pain, fever, or other concerning symptoms.    Wynelle Bourgeois CNM, MSN Certified Nurse-Midwife 01/07/2021  1:55 AM

## 2021-01-08 LAB — CULTURE, OB URINE

## 2021-01-10 LAB — GC/CHLAMYDIA PROBE AMP (~~LOC~~) NOT AT ARMC
Chlamydia: NEGATIVE
Comment: NEGATIVE
Comment: NORMAL
Neisseria Gonorrhea: NEGATIVE

## 2021-01-18 DIAGNOSIS — Z124 Encounter for screening for malignant neoplasm of cervix: Secondary | ICD-10-CM | POA: Diagnosis not present

## 2021-01-18 DIAGNOSIS — O09521 Supervision of elderly multigravida, first trimester: Secondary | ICD-10-CM | POA: Diagnosis not present

## 2021-01-18 DIAGNOSIS — Z3481 Encounter for supervision of other normal pregnancy, first trimester: Secondary | ICD-10-CM | POA: Diagnosis not present

## 2021-01-18 DIAGNOSIS — Z315 Encounter for genetic counseling: Secondary | ICD-10-CM | POA: Diagnosis not present

## 2021-01-18 DIAGNOSIS — Z113 Encounter for screening for infections with a predominantly sexual mode of transmission: Secondary | ICD-10-CM | POA: Diagnosis not present

## 2021-01-18 DIAGNOSIS — O09529 Supervision of elderly multigravida, unspecified trimester: Secondary | ICD-10-CM | POA: Diagnosis not present

## 2021-01-18 DIAGNOSIS — Z3A10 10 weeks gestation of pregnancy: Secondary | ICD-10-CM | POA: Diagnosis not present

## 2021-02-01 DIAGNOSIS — Z3A12 12 weeks gestation of pregnancy: Secondary | ICD-10-CM | POA: Diagnosis not present

## 2021-02-01 DIAGNOSIS — Z3682 Encounter for antenatal screening for nuchal translucency: Secondary | ICD-10-CM | POA: Diagnosis not present

## 2021-03-21 DIAGNOSIS — Z361 Encounter for antenatal screening for raised alphafetoprotein level: Secondary | ICD-10-CM | POA: Diagnosis not present

## 2021-03-21 DIAGNOSIS — Z3A16 16 weeks gestation of pregnancy: Secondary | ICD-10-CM | POA: Diagnosis not present

## 2021-03-21 DIAGNOSIS — Z3A18 18 weeks gestation of pregnancy: Secondary | ICD-10-CM | POA: Diagnosis not present

## 2021-03-21 DIAGNOSIS — Z363 Encounter for antenatal screening for malformations: Secondary | ICD-10-CM | POA: Diagnosis not present

## 2021-04-18 DIAGNOSIS — Z3A22 22 weeks gestation of pregnancy: Secondary | ICD-10-CM | POA: Diagnosis not present

## 2021-04-18 DIAGNOSIS — Z362 Encounter for other antenatal screening follow-up: Secondary | ICD-10-CM | POA: Diagnosis not present

## 2021-04-20 ENCOUNTER — Other Ambulatory Visit: Payer: Self-pay | Admitting: Obstetrics and Gynecology

## 2021-04-20 DIAGNOSIS — Z3689 Encounter for other specified antenatal screening: Secondary | ICD-10-CM

## 2021-04-25 ENCOUNTER — Other Ambulatory Visit: Payer: Self-pay

## 2021-05-05 ENCOUNTER — Encounter: Payer: Self-pay | Admitting: *Deleted

## 2021-05-05 ENCOUNTER — Ambulatory Visit: Payer: BC Managed Care – PPO | Admitting: *Deleted

## 2021-05-05 ENCOUNTER — Ambulatory Visit: Payer: BC Managed Care – PPO | Attending: Obstetrics and Gynecology

## 2021-05-05 ENCOUNTER — Other Ambulatory Visit: Payer: Self-pay

## 2021-05-05 VITALS — BP 127/72 | HR 92

## 2021-05-05 DIAGNOSIS — O10912 Unspecified pre-existing hypertension complicating pregnancy, second trimester: Secondary | ICD-10-CM | POA: Insufficient documentation

## 2021-05-05 DIAGNOSIS — Z3689 Encounter for other specified antenatal screening: Secondary | ICD-10-CM

## 2021-05-12 ENCOUNTER — Encounter (HOSPITAL_COMMUNITY): Payer: Self-pay | Admitting: Obstetrics & Gynecology

## 2021-05-12 ENCOUNTER — Inpatient Hospital Stay (HOSPITAL_BASED_OUTPATIENT_CLINIC_OR_DEPARTMENT_OTHER): Payer: BC Managed Care – PPO

## 2021-05-12 ENCOUNTER — Other Ambulatory Visit: Payer: Self-pay

## 2021-05-12 ENCOUNTER — Inpatient Hospital Stay (HOSPITAL_COMMUNITY)
Admission: AD | Admit: 2021-05-12 | Discharge: 2021-05-12 | Disposition: A | Discharge status: Home / Self Care | Payer: BC Managed Care – PPO | Attending: Obstetrics & Gynecology | Admitting: Obstetrics & Gynecology

## 2021-05-12 DIAGNOSIS — O4692 Antepartum hemorrhage, unspecified, second trimester: Secondary | ICD-10-CM

## 2021-05-12 DIAGNOSIS — O09522 Supervision of elderly multigravida, second trimester: Secondary | ICD-10-CM | POA: Diagnosis not present

## 2021-05-12 DIAGNOSIS — Z6790 Unspecified blood type, Rh positive: Secondary | ICD-10-CM | POA: Diagnosis not present

## 2021-05-12 DIAGNOSIS — Z3689 Encounter for other specified antenatal screening: Secondary | ICD-10-CM

## 2021-05-12 DIAGNOSIS — Z3A26 26 weeks gestation of pregnancy: Secondary | ICD-10-CM | POA: Diagnosis not present

## 2021-05-12 DIAGNOSIS — O09212 Supervision of pregnancy with history of pre-term labor, second trimester: Secondary | ICD-10-CM | POA: Diagnosis not present

## 2021-05-12 DIAGNOSIS — O26852 Spotting complicating pregnancy, second trimester: Secondary | ICD-10-CM | POA: Diagnosis not present

## 2021-05-12 DIAGNOSIS — Z3A28 28 weeks gestation of pregnancy: Secondary | ICD-10-CM | POA: Diagnosis not present

## 2021-05-12 LAB — WET PREP, GENITAL
Clue Cells Wet Prep HPF POC: NONE SEEN
Sperm: NONE SEEN
Trich, Wet Prep: NONE SEEN
WBC, Wet Prep HPF POC: <10 (ref <10)
Yeast Wet Prep HPF POC: NONE SEEN

## 2021-05-12 LAB — POCT FERN TEST: POCT Fern Test: NEGATIVE

## 2021-05-12 NOTE — MAU Provider Note (Signed)
History     CSN: [id number]  Arrival date and time: 05/12/21 1449   Event Date/Time   First Provider Initiated Contact with Patient 05/12/21 1527      Chief Complaint  Patient presents with   Vaginal Bleeding   35 y.o. G2P1001 @26 .4 wks presenting with VB. Reports seeing red blood when she wiped around 2pm. Bleeding has continued but less. Also reports leaking from time to time but feels this is urine. No gush. Denies abd pain, cramping, or ctx. Denies recent IC. No falls, injures, or bump to abdomen.   OB History     Gravida  2   Para  1   Term  1   Preterm      AB      Living  1      SAB      IAB      Ectopic      Multiple  0   Live Births  1           Past Medical History:  Diagnosis Date   ADHD    Anxiety    IBS (irritable bowel syndrome)    Kidney stones     Past Surgical History:  Procedure Laterality Date   WISDOM TOOTH EXTRACTION      Family History  Problem Relation Age of Onset   Cancer Mother    Diabetes Father    Cancer Maternal Grandmother    Heart disease Paternal Grandfather     Social History   Tobacco Use   Smoking status: Never   Smokeless tobacco: Never  Vaping Use   Vaping Use: Never used  Substance Use Topics   Alcohol use: Yes   Drug use: No    Allergies:  Allergies  Allergen Reactions   Morphine And Related Itching and Nausea Only   Vicodin [Hydrocodone-Acetaminophen] Itching and Nausea Only   Hydrocodone-Acetaminophen    Morphine Itching    Medications Prior to Admission  Medication Sig Dispense Refill Last Dose   acetaminophen (TYLENOL) 500 MG tablet Take 1,000 mg by mouth every 6 (six) hours as needed for mild pain.      cyclobenzaprine (FLEXERIL) 10 MG tablet Take 1 tablet (10 mg total) by mouth 2 (two) times daily as needed for muscle spasms. (Patient not taking: Reported on 05/05/2021) 20 tablet 0    Doxylamine-Pyridoxine 10-10 MG TBEC Take by mouth.      ondansetron (ZOFRAN-ODT) 4 MG  disintegrating tablet Take 4 mg by mouth every 8 (eight) hours as needed for nausea or vomiting. (Patient not taking: Reported on 05/05/2021)      Prenatal Vit-Fe Fumarate-FA (PRENATAL MULTIVITAMIN) TABS tablet Take 1 tablet by mouth at bedtime.        Review of Systems  Gastrointestinal:  Negative for abdominal pain.  Genitourinary:  Positive for vaginal bleeding. Negative for vaginal discharge.  Physical Exam   Blood pressure (!) 142/74, pulse (!) 103, temperature 97.9 F (36.6 C), resp. rate 18, height 5\' 3"  (1.6 m), weight 124.3 kg, last menstrual period 10/31/2020, unknown if currently breastfeeding.  Physical Exam Vitals and nursing note reviewed. Exam conducted with a chaperone present.  Constitutional:      General: She is not in acute distress.    Appearance: Normal appearance.  HENT:     Head: Normocephalic and atraumatic.  Pulmonary:     Effort: Pulmonary effort is normal. No respiratory distress.  Abdominal:     Palpations: Abdomen is soft.     Tenderness:  There is no abdominal tenderness.     Comments: gravid  Genitourinary:    Comments: SSE: thin white discharge, no blood or trace of blood, no pool SVE: closed/thick Musculoskeletal:     Cervical back: Normal range of motion.  Neurological:     Mental Status: She is alert.  EFM: 150 bpm, mod variability, + accels, no decels Toco: none  Results for orders placed or performed during the hospital encounter of 05/12/21 (from the past 24 hour(s))  Wet prep, genital     Status: None   Collection Time: 05/12/21  3:45 PM  Result Value Ref Range   Yeast Wet Prep HPF POC NONE SEEN NONE SEEN   Trich, Wet Prep NONE SEEN NONE SEEN   Clue Cells Wet Prep HPF POC NONE SEEN NONE SEEN   WBC, Wet Prep HPF POC <10 <10   Sperm NONE SEEN   POCT fern test     Status: None   Collection Time: 05/12/21  4:16 PM  Result Value Ref Range   POCT Fern Test Negative = intact amniotic membranes     Media Information   MAU Course   Procedures  MDM Prenatal records reviewed: pregnancy complicated by AMA, CHTN no meds, and obesity. No trace of blood seen on exam. Pt reports having BM earlier but feels the blood came from her vagina. Labs and [place] ordered and reviewed. No signs of PTL or abruption. Consult with Dr. Crissie Reese, ok for discharge home.   Assessment and Plan   1. [redacted] weeks gestation of pregnancy   2. Vaginal bleeding in pregnancy, second trimester   3. NST (non-stress test) reactive   4. Blood type, Rh positive    Discharge home Follow up at [facility] next week PTL/abruption precautions FMCs Pelvic rest  Allergies as of 05/12/2021       Reactions   Morphine And Related Itching, Nausea Only   Vicodin [hydrocodone-acetaminophen] Itching, Nausea Only   Hydrocodone-acetaminophen    Morphine Itching        Medication List     STOP taking these medications    cyclobenzaprine 10 MG tablet Commonly known as: FLEXERIL   ondansetron 4 MG disintegrating tablet Commonly known as: ZOFRAN-ODT       TAKE these medications    acetaminophen 500 MG tablet Commonly known as: TYLENOL Take 1,000 mg by mouth every 6 (six) hours as needed for mild pain.   Doxylamine-Pyridoxine 10-10 MG Tbec Take by mouth.   prenatal multivitamin Tabs tablet Take 1 tablet by mouth at bedtime.       Donette Larry, CNM 05/12/2021, 5:32 PM

## 2021-05-12 NOTE — MAU Note (Signed)
Pt reports she woke up from a nap went to BR and had some blood on tissue. Denies any pain or cramping. Had been feeling regular Fetal movement up until that point but now is unsure die to anxiety. Intercourse 3 days ago.

## 2021-05-13 LAB — GC/CHLAMYDIA PROBE AMP (~~LOC~~) NOT AT ARMC
Chlamydia: NEGATIVE
Comment: NEGATIVE
Comment: NORMAL
Neisseria Gonorrhea: NEGATIVE

## 2021-05-15 NOTE — L&D Delivery Note (Signed)
Delivery Note ?At 5:30 AM a viable female was delivered via Vaginal, Spontaneous (Presentation: Left Occiput Anterior).  APGAR: 7, 9; weight 7 lb 8 oz (3402 g).   ?Placenta status: Spontaneous, Intact.  Cord: 3 vessels with the following complications: None.  Cord pH: 7.23 arterial ? ?Uterus manually explored and cleared of clot and debris, good tone noted. ? ?Anesthesia: Epidural ?Episiotomy: None ?Lacerations: Perineal;1st degree, RV exam confirms ?Suture Repair: 2.0 vicryl rapide ?Est. Blood Loss (mL):  250 cc ? ?Mom to postpartum.  Baby to Couplet care / Skin to Skin. ? ?[name] ?07/28/2021, 5:56 AM ? ? ? ?

## 2021-05-19 DIAGNOSIS — Z361 Encounter for antenatal screening for raised alphafetoprotein level: Secondary | ICD-10-CM | POA: Diagnosis not present

## 2021-06-02 DIAGNOSIS — Z3A29 29 weeks gestation of pregnancy: Secondary | ICD-10-CM | POA: Diagnosis not present

## 2021-06-02 DIAGNOSIS — Z23 Encounter for immunization: Secondary | ICD-10-CM | POA: Diagnosis not present

## 2021-06-02 DIAGNOSIS — O10019 Pre-existing essential hypertension complicating pregnancy, unspecified trimester: Secondary | ICD-10-CM | POA: Diagnosis not present

## 2021-07-06 DIAGNOSIS — Z8679 Personal history of other diseases of the circulatory system: Secondary | ICD-10-CM | POA: Diagnosis not present

## 2021-07-06 DIAGNOSIS — Z3A34 34 weeks gestation of pregnancy: Secondary | ICD-10-CM | POA: Diagnosis not present

## 2021-07-12 ENCOUNTER — Inpatient Hospital Stay (HOSPITAL_COMMUNITY)
Admission: AD | Admit: 2021-07-12 | Discharge: 2021-07-13 | Disposition: A | Discharge status: Home / Self Care | Payer: BC Managed Care – PPO | Attending: Obstetrics and Gynecology | Admitting: Obstetrics and Gynecology

## 2021-07-12 ENCOUNTER — Other Ambulatory Visit: Payer: Self-pay

## 2021-07-12 DIAGNOSIS — O10013 Pre-existing essential hypertension complicating pregnancy, third trimester: Secondary | ICD-10-CM | POA: Diagnosis not present

## 2021-07-12 DIAGNOSIS — O10919 Unspecified pre-existing hypertension complicating pregnancy, unspecified trimester: Secondary | ICD-10-CM

## 2021-07-12 DIAGNOSIS — Z3A35 35 weeks gestation of pregnancy: Secondary | ICD-10-CM | POA: Diagnosis not present

## 2021-07-12 DIAGNOSIS — R030 Elevated blood-pressure reading, without diagnosis of hypertension: Secondary | ICD-10-CM | POA: Diagnosis not present

## 2021-07-12 DIAGNOSIS — R202 Paresthesia of skin: Secondary | ICD-10-CM | POA: Diagnosis not present

## 2021-07-12 DIAGNOSIS — O26893 Other specified pregnancy related conditions, third trimester: Secondary | ICD-10-CM | POA: Insufficient documentation

## 2021-07-12 DIAGNOSIS — Z885 Allergy status to narcotic agent status: Secondary | ICD-10-CM | POA: Insufficient documentation

## 2021-07-12 DIAGNOSIS — Z7982 Long term (current) use of aspirin: Secondary | ICD-10-CM | POA: Diagnosis not present

## 2021-07-12 DIAGNOSIS — Z3689 Encounter for other specified antenatal screening: Secondary | ICD-10-CM

## 2021-07-12 DIAGNOSIS — Z8679 Personal history of other diseases of the circulatory system: Secondary | ICD-10-CM | POA: Diagnosis not present

## 2021-07-12 NOTE — MAU Note (Addendum)
PT SAYS SHE FEELS TINGLING IN HER FACE - STARTED AT 9PM- IS NOW ALSO SPREADING UPWARD ON FACE.  TODAY IN OFFICE - BP  ELEVATED  HUSBAND WENT BOUGHT BP CUFF- AT 10P- BP WAS 147/89 WAS SUPPOSE TO STARTED BABY ASA - CAN'T REMEMBER IF SHE TOOK IT TODAY  NO H/A, NO BLURRED VISION., NO EPIGASTRIC PAIN  SAYS HAS FAMILY ISSUES- AND FEELS HER ANXIETY IS HIGH

## 2021-07-13 ENCOUNTER — Encounter (HOSPITAL_COMMUNITY): Payer: Self-pay | Admitting: Obstetrics and Gynecology

## 2021-07-13 DIAGNOSIS — O10919 Unspecified pre-existing hypertension complicating pregnancy, unspecified trimester: Secondary | ICD-10-CM | POA: Diagnosis not present

## 2021-07-13 DIAGNOSIS — Z3A35 35 weeks gestation of pregnancy: Secondary | ICD-10-CM

## 2021-07-13 LAB — COMPREHENSIVE METABOLIC PANEL
ALT: 13 U/L (ref 0–44)
AST: 17 U/L (ref 15–41)
Albumin: 2.5 g/dL — ABNORMAL LOW (ref 3.5–5.0)
Alkaline Phosphatase: 87 U/L (ref 38–126)
Anion gap: 8 (ref 5–15)
BUN: 6 mg/dL (ref 6–20)
CO2: 22 mmol/L (ref 22–32)
Calcium: 8.5 mg/dL — ABNORMAL LOW (ref 8.9–10.3)
Chloride: 106 mmol/L (ref 98–111)
Creatinine, Ser: 0.55 mg/dL (ref 0.44–1.00)
GFR, Estimated: >60 mL/min (ref >60)
Glucose, Bld: 111 mg/dL — ABNORMAL HIGH (ref 70–99)
Potassium: 3.8 mmol/L (ref 3.5–5.1)
Sodium: 136 mmol/L (ref 135–145)
Total Bilirubin: <0.1 mg/dL — ABNORMAL LOW (ref 0.3–1.2)
Total Protein: 6.2 g/dL — ABNORMAL LOW (ref 6.5–8.1)

## 2021-07-13 LAB — PROTEIN / CREATININE RATIO, URINE
Creatinine, Urine: 185.18 mg/dL
Protein Creatinine Ratio: 0.19 mg/mg{Cre} — ABNORMAL HIGH (ref 0.00–0.15)
Total Protein, Urine: 36 mg/dL

## 2021-07-13 LAB — CBC
HCT: 33.1 % — ABNORMAL LOW (ref 36.0–46.0)
Hemoglobin: 11.1 g/dL — ABNORMAL LOW (ref 12.0–15.0)
MCH: 28 pg (ref 26.0–34.0)
MCHC: 33.5 g/dL (ref 30.0–36.0)
MCV: 83.6 fL (ref 80.0–100.0)
Platelets: 252 10*3/uL (ref 150–400)
RBC: 3.96 MIL/uL (ref 3.87–5.11)
RDW: 14.6 % (ref 11.5–15.5)
WBC: 13.8 10*3/uL — ABNORMAL HIGH (ref 4.0–10.5)
nRBC: 0 % (ref 0.0–0.2)

## 2021-07-13 MED ORDER — LABETALOL HCL 200 MG PO TABS: 200.0000 mg | ORAL_TABLET | Freq: Two times a day (BID) | ORAL | 0 refills | Status: DC

## 2021-07-13 MED ORDER — DIPHENHYDRAMINE HCL 25 MG PO CAPS
50.0000 mg | ORAL_CAPSULE | Freq: Once | ORAL | Status: AC
  Administered 2021-07-13: 50 mg via ORAL
  Filled 2021-07-13: qty 2

## 2021-07-13 NOTE — MAU Provider Note (Signed)
?History  ?  ? ?CSN: [id number] ? ?Arrival date and time: 07/12/21 2341 ? ? Event Date/Time  ? First Provider Initiated Contact with Patient 07/13/21 0032   ?  ? ?Chief Complaint  ?Patient presents with  ? Hypertension  ? ?HPI ?Teresa Curry is a 36 y.o. G2P1001 at [redacted]w[redacted]d who presents to MAU for evaluation of elevated blood pressure on her home cuff. Pregnancy is c/b Chronic Hypertension, not on medication. Patient recently purchased a home cuff and obtained a reading of 147/89 on her home cuff.  ? ?Patient also reports new onset generalized tingling near her mouth. She believes this may be associated with anxiety in the setting of recent personal stress. She denies headache, visual changes, RUQ/epigastric pain, new onset swelling or weight gain. ? ?Patient receives care with [facility]. ? ?OB History   ? ? Gravida  ?2  ? Para  ?1  ? Term  ?1  ? Preterm  ?   ? AB  ?   ? Living  ?1  ?  ? ? SAB  ?   ? IAB  ?   ? Ectopic  ?   ? Multiple  ?0  ? Live Births  ?1  ?   ?  ?  ? ? ?Past Medical History:  ?Diagnosis Date  ? ADHD   ? Anxiety   ? IBS (irritable bowel syndrome)   ? Kidney stones   ? ? ?Past Surgical History:  ?Procedure Laterality Date  ? WISDOM TOOTH EXTRACTION    ? ? ?Family History  ?Problem Relation Age of Onset  ? Cancer Mother   ? Diabetes Father   ? Cancer Maternal Grandmother   ? Heart disease Paternal Grandfather   ? ? ?Social History  ? ?Tobacco Use  ? Smoking status: Never  ? Smokeless tobacco: Never  ?Vaping Use  ? Vaping Use: Never used  ?Substance Use Topics  ? Alcohol use: Not Currently  ? Drug use: No  ? ? ?Allergies:  ?Allergies  ?Allergen Reactions  ? Morphine And Related Itching and Nausea Only  ? Vicodin [Hydrocodone-Acetaminophen] Itching and Nausea Only  ? Hydrocodone-Acetaminophen   ?  PT states no reaction last given ?  ? Morphine Itching  ? ? ?Medications Prior to Admission  ?Medication Sig Dispense Refill Last Dose  ? acetaminophen (TYLENOL) 500 MG tablet Take 1,000 mg by mouth  every 6 (six) hours as needed for mild pain.   07/12/2021  ? aspirin EC 81 MG tablet Take 81 mg by mouth daily. Swallow whole.   Past Week  ? Doxylamine-Pyridoxine 10-10 MG TBEC Take by mouth.   07/12/2021  ? Prenatal Vit-Fe Fumarate-FA (PRENATAL MULTIVITAMIN) TABS tablet Take 1 tablet by mouth at bedtime.    07/12/2021  ? ? ?Review of Systems  ?Constitutional:  Positive for fatigue.  ?Neurological:   ?     Facial "tingling"  ?All other systems reviewed and are negative. ?Physical Exam  ? ?Blood pressure (!) 141/71, pulse 95, temperature (!) 97.5 ?F (36.4 ?C), temperature source Oral, resp. rate 20, height 5' 2.5" (1.588 m), weight 130.3 kg, last menstrual period 10/31/2020, SpO2 100 %, unknown if currently breastfeeding. ? ?Physical Exam ?Vitals and nursing note reviewed. Exam conducted with a chaperone present.  ?Constitutional:   ?   Appearance: Normal appearance. She is not ill-appearing.  ?Cardiovascular:  ?   Rate and Rhythm: Normal rate and regular rhythm.  ?Pulmonary:  ?   Effort: Pulmonary effort is normal.  ?  Breath sounds: Normal breath sounds.  ?Abdominal:  ?   Comments: Gravid  ?Skin: ?   Capillary Refill: Capillary refill takes less than 2 seconds.  ?Neurological:  ?   Mental Status: She is alert and oriented to person, place, and time.  ? ? ?MAU Course  ?Procedures ? ?MDM ?--EMR reviewed ?--Discussed patient's valid concern that facial tingling may be associated with anxiety. Offered Benadryl vs Vistaril. Patient elected Benadryl. Tingling sensation resolved 60 min s/p Benadryl. ?--Discussed with Dr. Corinna Capra at [phone]. Given Chronic HTN and irregular elevated readings, advised to started Labetalol 200 mg BID. Dr. Corinna Capra agreeable ?--Reactive tracing: baseline 135, mod var, + accels, no decels ?--Toco: overall quiet, ctx x 2 ? ?Patient Vitals for the past 24 hrs: ? BP Temp Temp src Pulse Resp SpO2 Height Weight  ?07/13/21 0200 (!) 141/71 -- -- 95 -- 100 % -- --  ?07/13/21 0115 136/85 -- -- (!) 101 -- 99 % --  --  ?07/13/21 0100 132/73 -- -- (!) 104 -- -- -- --  ?07/13/21 0045 (!) 144/75 -- -- 99 -- 99 % -- --  ?07/13/21 0031 (!) 144/77 -- -- 100 20 98 % -- --  ?07/13/21 0030 (!) 144/77 -- -- (!) 101 -- 99 % -- --  ?07/12/21 2356 122/79 (!) 97.5 ?F (36.4 ?C) Oral (!) 106 (!) 22 -- 5' 2.5" (1.588 m) 130.3 kg  ? ?Results for orders placed or performed during the hospital encounter of 07/12/21 (from the past 24 hour(s))  ?Protein / creatinine ratio, urine     Status: Abnormal  ? Collection Time: 07/12/21 11:55 PM  ?Result Value Ref Range  ? Creatinine, Urine 185.18 mg/dL  ? Total Protein, Urine 36 mg/dL  ? Protein Creatinine Ratio 0.19 (H) 0.00 - 0.15 mg/mg[Cre]  ?CBC     Status: Abnormal  ? Collection Time: 07/13/21  1:07 AM  ?Result Value Ref Range  ? WBC 13.8 (H) 4.0 - 10.5 K/uL  ? RBC 3.96 3.87 - 5.11 MIL/uL  ? Hemoglobin 11.1 (L) 12.0 - 15.0 g/dL  ? HCT 33.1 (L) 36.0 - 46.0 %  ? MCV 83.6 80.0 - 100.0 fL  ? MCH 28.0 26.0 - 34.0 pg  ? MCHC 33.5 30.0 - 36.0 g/dL  ? RDW 14.6 11.5 - 15.5 %  ? Platelets 252 150 - 400 K/uL  ? nRBC 0.0 0.0 - 0.2 %  ?Comprehensive metabolic panel     Status: Abnormal  ? Collection Time: 07/13/21  1:07 AM  ?Result Value Ref Range  ? Sodium 136 135 - 145 mmol/L  ? Potassium 3.8 3.5 - 5.1 mmol/L  ? Chloride 106 98 - 111 mmol/L  ? CO2 22 22 - 32 mmol/L  ? Glucose, Bld 111 (H) 70 - 99 mg/dL  ? BUN 6 6 - 20 mg/dL  ? Creatinine, Ser 0.55 0.44 - 1.00 mg/dL  ? Calcium 8.5 (L) 8.9 - 10.3 mg/dL  ? Total Protein 6.2 (L) 6.5 - 8.1 g/dL  ? Albumin 2.5 (L) 3.5 - 5.0 g/dL  ? AST 17 15 - 41 U/L  ? ALT 13 0 - 44 U/L  ? Alkaline Phosphatase 87 38 - 126 U/L  ? Total Bilirubin <0.1 (L) 0.3 - 1.2 mg/dL  ? GFR, Estimated >60 >60 mL/min  ? Anion gap 8 5 - 15  ? ?Meds ordered this encounter  ?Medications  ? diphenhydrAMINE (BENADRYL) capsule 50 mg  ? labetalol (NORMODYNE) 200 MG tablet  ?  Sig: Take 1 tablet (200  mg total) by mouth 2 (two) times daily.  ?  Dispense:  60 tablet  ?  Refill:  0  ?  Order Specific Question:    Supervising Provider  ?  AnswerAletha Halim [id number]  ? ?Assessment and Plan  ?--36 y.o. G2P1001 at [redacted]w[redacted]d  ?--CHTN, initiate Labetalol 200mg  BID per discussion with Dr. Corinna Capra ?--PEC labs WNL ?--Reactive tracing ?--Discharge home in stable condition with Preeclampsia precautions ? ?Darlina Rumpf, CNM ?07/13/2021, 3:46 AM  ?

## 2021-07-20 ENCOUNTER — Telehealth (HOSPITAL_COMMUNITY): Payer: Self-pay | Admitting: *Deleted

## 2021-07-20 DIAGNOSIS — O139 Gestational [pregnancy-induced] hypertension without significant proteinuria, unspecified trimester: Secondary | ICD-10-CM | POA: Diagnosis not present

## 2021-07-20 DIAGNOSIS — O99213 Obesity complicating pregnancy, third trimester: Secondary | ICD-10-CM | POA: Diagnosis not present

## 2021-07-20 DIAGNOSIS — O10013 Pre-existing essential hypertension complicating pregnancy, third trimester: Secondary | ICD-10-CM | POA: Diagnosis not present

## 2021-07-20 DIAGNOSIS — Z3A36 36 weeks gestation of pregnancy: Secondary | ICD-10-CM | POA: Diagnosis not present

## 2021-07-20 DIAGNOSIS — Z361 Encounter for antenatal screening for raised alphafetoprotein level: Secondary | ICD-10-CM | POA: Diagnosis not present

## 2021-07-20 LAB — OB RESULTS CONSOLE GBS: GBS: NEGATIVE

## 2021-07-20 NOTE — Telephone Encounter (Signed)
Preadmission screen  

## 2021-07-21 ENCOUNTER — Telehealth (HOSPITAL_COMMUNITY): Payer: Self-pay | Admitting: *Deleted

## 2021-07-21 ENCOUNTER — Encounter (HOSPITAL_COMMUNITY): Payer: Self-pay | Admitting: *Deleted

## 2021-07-21 NOTE — Telephone Encounter (Signed)
Preadmission screen  

## 2021-07-25 ENCOUNTER — Other Ambulatory Visit: Payer: Self-pay | Admitting: Obstetrics and Gynecology

## 2021-07-25 DIAGNOSIS — O99891 Other specified diseases and conditions complicating pregnancy: Secondary | ICD-10-CM | POA: Diagnosis not present

## 2021-07-25 LAB — SARS CORONAVIRUS 2 (TAT 6-24 HRS): SARS Coronavirus 2: NEGATIVE

## 2021-07-26 NOTE — H&P (Signed)
OB History and Physical ?Preadmission H&P for scheduled IOL ? ?Teresa Curry is a 36 y.o. female G2P1001 presenting for induction of labor at [redacted]w[redacted]d for gestational hypertension on labetalol.  She had a previous diagnosis of cHTN in prior pregnancy, but normotensive throughout this pregnancy until third trimester. ? ?Rh positive, GBS negative.  Last EFW at 36w was 75%ile. ? ? ?OB History   ? ? Gravida  ?2  ? Para  ?1  ? Term  ?1  ? Preterm  ?   ? AB  ?   ? Living  ?1  ?  ? ? SAB  ?   ? IAB  ?   ? Ectopic  ?   ? Multiple  ?0  ? Live Births  ?1  ?   ?  ?  ? ?Past Medical History:  ?Diagnosis Date  ? ADHD   ? Anxiety   ? IBS (irritable bowel syndrome)   ? Kidney stones   ? Pregnancy induced hypertension   ? ?Past Surgical History:  ?Procedure Laterality Date  ? WISDOM TOOTH EXTRACTION    ? ?Family History: family history includes Cancer in her maternal grandmother and mother; Diabetes in her father; Heart disease in her paternal grandfather. ?Social History:  reports that she has never smoked. She has never used smokeless tobacco. She reports that she does not currently use alcohol. She reports that she does not use drugs. ? ? ?  ?Maternal Diabetes: No ?Genetic Screening: Declined ?Maternal Ultrasounds/Referrals: Normal ?Fetal Ultrasounds or other Referrals:  None ?Maternal Substance Abuse:  No ?Significant Maternal Medications:  Labetalol ?Significant Maternal Lab Results:  Group B Strep negative ?Other Comments:  None ? ?Review of Systems - Patient denies fever, chills, SOB, CP, N/V/D.  ?History ?  ?Last menstrual period 10/31/2020, unknown if currently breastfeeding. ?Exam ?Physical Exam  ? ?Gen: alert, well appearing, no distress ?Chest: nonlabored breathing ?CV: no peripheral edema ?Abdomen: soft, gravid  ?Ext: no evidence of DVT ? ?Prenatal labs: ?ABO, Rh: O/Positive/-- (08/24 0000) ?Antibody: Negative (08/24 0000) ?Rubella: Immune (08/24 0000) ?RPR: Nonreactive (08/24 0000)  ?HBsAg: Negative (08/24 0000)  ?HIV:  Non-reactive (08/24 0000)  ?GBS: Negative/-- (03/08 0000)  ? ?Assessment/Plan: ?Admit to Labor and Delivery ?Cytotec for cervical ripening, followed by pitocin, AROM ?Epidural when desired ?Treat HTN per protocol  ? ?Lyn Henri ?07/26/2021, 9:33 PM ? ? ? ? ?

## 2021-07-27 ENCOUNTER — Encounter (HOSPITAL_COMMUNITY): Payer: Self-pay | Admitting: Obstetrics and Gynecology

## 2021-07-27 ENCOUNTER — Inpatient Hospital Stay (HOSPITAL_COMMUNITY): Payer: BC Managed Care – PPO

## 2021-07-27 ENCOUNTER — Other Ambulatory Visit: Payer: Self-pay

## 2021-07-27 ENCOUNTER — Inpatient Hospital Stay (HOSPITAL_COMMUNITY): Payer: BC Managed Care – PPO | Admitting: Anesthesiology

## 2021-07-27 ENCOUNTER — Inpatient Hospital Stay (HOSPITAL_COMMUNITY)
Admission: AD | Admit: 2021-07-27 | Discharge: 2021-07-29 | DRG: 807 | Disposition: A | Discharge status: Home / Self Care | Payer: BC Managed Care – PPO | Attending: Obstetrics and Gynecology | Admitting: Obstetrics and Gynecology

## 2021-07-27 DIAGNOSIS — O164 Unspecified maternal hypertension, complicating childbirth: Secondary | ICD-10-CM | POA: Diagnosis not present

## 2021-07-27 DIAGNOSIS — O134 Gestational [pregnancy-induced] hypertension without significant proteinuria, complicating childbirth: Secondary | ICD-10-CM | POA: Diagnosis not present

## 2021-07-27 DIAGNOSIS — O99214 Obesity complicating childbirth: Secondary | ICD-10-CM | POA: Diagnosis present

## 2021-07-27 DIAGNOSIS — O169 Unspecified maternal hypertension, unspecified trimester: Principal | ICD-10-CM | POA: Diagnosis present

## 2021-07-27 DIAGNOSIS — Z3A37 37 weeks gestation of pregnancy: Secondary | ICD-10-CM | POA: Diagnosis not present

## 2021-07-27 DIAGNOSIS — Z23 Encounter for immunization: Secondary | ICD-10-CM | POA: Diagnosis not present

## 2021-07-27 LAB — CBC
HCT: 32.9 % — ABNORMAL LOW (ref 36.0–46.0)
HCT: 32.3 % — ABNORMAL LOW (ref 36.0–46.0)
Hemoglobin: 11 g/dL — ABNORMAL LOW (ref 12.0–15.0)
Hemoglobin: 10.6 g/dL — ABNORMAL LOW (ref 12.0–15.0)
MCH: 28.2 pg (ref 26.0–34.0)
MCH: 27.5 pg (ref 26.0–34.0)
MCHC: 33.4 g/dL (ref 30.0–36.0)
MCHC: 32.8 g/dL (ref 30.0–36.0)
MCV: 84.4 fL (ref 80.0–100.0)
MCV: 83.7 fL (ref 80.0–100.0)
Platelets: 240 10*3/uL (ref 150–400)
Platelets: 236 10*3/uL (ref 150–400)
RBC: 3.9 MIL/uL (ref 3.87–5.11)
RBC: 3.86 MIL/uL — ABNORMAL LOW (ref 3.87–5.11)
RDW: 15.7 % — ABNORMAL HIGH (ref 11.5–15.5)
RDW: 15.7 % — ABNORMAL HIGH (ref 11.5–15.5)
WBC: 14.4 10*3/uL — ABNORMAL HIGH (ref 4.0–10.5)
WBC: 12.6 10*3/uL — ABNORMAL HIGH (ref 4.0–10.5)
nRBC: 0 % (ref 0.0–0.2)
nRBC: 0 % (ref 0.0–0.2)

## 2021-07-27 LAB — TYPE AND SCREEN
ABO/RH(D): O POS
Antibody Screen: NEGATIVE

## 2021-07-27 LAB — RPR: RPR Ser Ql: NONREACTIVE

## 2021-07-27 MED ORDER — TERBUTALINE SULFATE 1 MG/ML IJ SOLN: 0.2500 mg | Freq: Once | INTRAMUSCULAR | Status: DC | PRN

## 2021-07-27 MED ORDER — MISOPROSTOL 25 MCG QUARTER TABLET
25.0000 ug | ORAL_TABLET | ORAL | Status: DC | PRN
  Administered 2021-07-27 (×2): 25 ug via VAGINAL
  Filled 2021-07-27 (×2): qty 1

## 2021-07-27 MED ORDER — PHENYLEPHRINE 40 MCG/ML (10ML) SYRINGE FOR IV PUSH (FOR BLOOD PRESSURE SUPPORT): 80.0000 ug | PREFILLED_SYRINGE | INTRAVENOUS | Status: DC | PRN

## 2021-07-27 MED ORDER — OXYTOCIN-SODIUM CHLORIDE 30-0.9 UT/500ML-% IV SOLN
2.5000 [IU]/h | INTRAVENOUS | Status: DC
  Filled 2021-07-27 (×2): qty 500

## 2021-07-27 MED ORDER — LACTATED RINGERS IV SOLN: 500.0000 mL | INTRAVENOUS | Status: DC | PRN

## 2021-07-27 MED ORDER — PHENYLEPHRINE 40 MCG/ML (10ML) SYRINGE FOR IV PUSH (FOR BLOOD PRESSURE SUPPORT)
80.0000 ug | PREFILLED_SYRINGE | INTRAVENOUS | Status: DC | PRN
  Filled 2021-07-27: qty 10

## 2021-07-27 MED ORDER — EPHEDRINE 5 MG/ML INJ: 10.0000 mg | INTRAVENOUS | Status: DC | PRN

## 2021-07-27 MED ORDER — SOD CITRATE-CITRIC ACID 500-334 MG/5ML PO SOLN: 30.0000 mL | ORAL | Status: DC | PRN

## 2021-07-27 MED ORDER — LIDOCAINE HCL (PF) 1 % IJ SOLN
INTRAMUSCULAR | Status: DC | PRN
  Administered 2021-07-27 (×2): 4 mL via EPIDURAL

## 2021-07-27 MED ORDER — DIPHENHYDRAMINE HCL 50 MG/ML IJ SOLN
12.5000 mg | INTRAMUSCULAR | Status: DC | PRN
  Administered 2021-07-27: 12.5 mg via INTRAVENOUS
  Filled 2021-07-27: qty 1

## 2021-07-27 MED ORDER — LACTATED RINGERS IV SOLN: INTRAVENOUS | Status: DC

## 2021-07-27 MED ORDER — LABETALOL HCL 200 MG PO TABS
200.0000 mg | ORAL_TABLET | Freq: Two times a day (BID) | ORAL | Status: DC
  Administered 2021-07-27 (×2): 200 mg via ORAL
  Filled 2021-07-27 (×2): qty 1

## 2021-07-27 MED ORDER — LACTATED RINGERS IV SOLN
500.0000 mL | Freq: Once | INTRAVENOUS | Status: AC
  Administered 2021-07-27: 500 mL via INTRAVENOUS

## 2021-07-27 MED ORDER — FENTANYL-BUPIVACAINE-NACL 0.5-0.125-0.9 MG/250ML-% EP SOLN
12.0000 mL/h | EPIDURAL | Status: DC | PRN
  Administered 2021-07-27 – 2021-07-28 (×2): 12 mL/h via EPIDURAL
  Filled 2021-07-27 (×2): qty 250

## 2021-07-27 MED ORDER — ACETAMINOPHEN 325 MG PO TABS
650.0000 mg | ORAL_TABLET | ORAL | Status: DC | PRN
  Administered 2021-07-28 (×2): 650 mg via ORAL
  Filled 2021-07-27 (×2): qty 2

## 2021-07-27 MED ORDER — HYDROXYZINE HCL 50 MG PO TABS
50.0000 mg | ORAL_TABLET | Freq: Four times a day (QID) | ORAL | Status: DC | PRN
  Administered 2021-07-27 – 2021-07-28 (×4): 50 mg via ORAL
  Filled 2021-07-27 (×4): qty 1

## 2021-07-27 MED ORDER — OXYTOCIN-SODIUM CHLORIDE 30-0.9 UT/500ML-% IV SOLN
1.0000 m[IU]/min | INTRAVENOUS | Status: DC
  Administered 2021-07-27: 2 m[IU]/min via INTRAVENOUS

## 2021-07-27 MED ORDER — OXYTOCIN BOLUS FROM INFUSION
333.0000 mL | Freq: Once | INTRAVENOUS | Status: AC
  Administered 2021-07-28: 333 mL via INTRAVENOUS

## 2021-07-27 MED ORDER — ONDANSETRON HCL 4 MG/2ML IJ SOLN
4.0000 mg | Freq: Four times a day (QID) | INTRAMUSCULAR | Status: DC | PRN
  Administered 2021-07-27: 4 mg via INTRAVENOUS
  Filled 2021-07-27: qty 2

## 2021-07-27 MED ORDER — LIDOCAINE HCL (PF) 1 % IJ SOLN: 30.0000 mL | INTRAMUSCULAR | Status: DC | PRN

## 2021-07-27 NOTE — Anesthesia Procedure Notes (Signed)
Epidural ?Patient location during procedure: OB ?Start time: 07/27/2021 11:11 AM ?End time: 07/27/2021 11:14 AM ? ?Staffing ?Anesthesiologist: Brennan Bailey, MD ?Performed: anesthesiologist  ? ?Preanesthetic Checklist ?Completed: patient identified, IV checked, risks and benefits discussed, monitors and equipment checked, pre-op evaluation and timeout performed ? ?Epidural ?Patient position: sitting ?Prep: DuraPrep and site prepped and draped ?Patient monitoring: continuous pulse ox, blood pressure and heart rate ?Approach: midline ?Location: L3-L4 ?Injection technique: LOR air ? ?Needle:  ?Needle type: Tuohy  ?Needle gauge: 17 G ?Needle length: 9 cm ?Catheter type: closed end flexible ?Catheter size: 19 Gauge ?Catheter at skin depth: 12 cm ?Test dose: negative and Other (1% lidocaine) ? ?Assessment ?Events: blood not aspirated, injection not painful, no injection resistance, no paresthesia and negative IV test ? ?Additional Notes ?Patient identified. Risks, benefits, and alternatives discussed with patient including but not limited to bleeding, infection, nerve damage, paralysis, failed block, incomplete pain control, headache, blood pressure changes, nausea, vomiting, reactions to medication, itching, and postpartum back pain. Confirmed with bedside nurse the patient's most recent platelet count. Confirmed with patient that they are not currently taking any anticoagulation, have any bleeding history, or any family history of bleeding disorders. Patient expressed understanding and wished to proceed. All questions were answered. Sterile technique was used throughout the entire procedure. Please see nursing notes for vital signs.  ? ?Crisp LOR on first pass. Test dose was given through epidural catheter and negative prior to continuing to dose epidural or start infusion. Warning signs of high block given to the patient including shortness of breath, tingling/numbness in hands, complete motor block, or any concerning  symptoms with instructions to call for help. Patient was given instructions on fall risk and not to get out of bed. All questions and concerns addressed with instructions to call with any issues or inadequate analgesia.  Reason for block:procedure for pain ? ? ? ?

## 2021-07-27 NOTE — Anesthesia Preprocedure Evaluation (Signed)
Anesthesia Evaluation  ?Patient identified by MRN, date of birth, ID band ?Patient awake ? ? ? ?Reviewed: ?Allergy & Precautions, Patient's Chart, lab work & pertinent test results ? ?History of Anesthesia Complications ?Negative for: history of anesthetic complications ? ?Airway ?Mallampati: II ? ?TM Distance: >3 FB ?Neck ROM: Full ? ? ? Dental ?no notable dental hx. ? ?  ?Pulmonary ?neg pulmonary ROS,  ?  ?Pulmonary exam normal ? ? ? ? ? ? ? Cardiovascular ?hypertension, Normal cardiovascular exam ? ? ?  ?Neuro/Psych ?Anxiety negative neurological ROS ?   ? GI/Hepatic ?negative GI ROS, Neg liver ROS,   ?Endo/Other  ?Morbid obesity ? Renal/GU ?negative Renal ROS  ?negative genitourinary ?  ?Musculoskeletal ?negative musculoskeletal ROS ?(+)  ? Abdominal ?  ?Peds ? Hematology ?negative hematology ROS ?(+)   ?Anesthesia Other Findings ?Day of surgery medications reviewed with patient. ? Reproductive/Obstetrics ?(+) Pregnancy ? ?  ? ? ? ? ? ? ? ? ? ? ? ? ? ?  ?  ? ? ? ? ? ? ? ? ?Anesthesia Physical ?Anesthesia Plan ? ?ASA: 3 ? ?Anesthesia Plan: Epidural  ? ?Post-op Pain Management:   ? ?Induction:  ? ?PONV Risk Score and Plan: Treatment may vary due to age or medical condition ? ?Airway Management Planned: Natural Airway ? ?Additional Equipment: Fetal Monitoring ? ?Intra-op Plan:  ? ?Post-operative Plan:  ? ?Informed Consent: I have reviewed the patients History and Physical, chart, labs and discussed the procedure including the risks, benefits and alternatives for the proposed anesthesia with the patient or authorized representative who has indicated his/her understanding and acceptance.  ? ? ? ? ? ?Plan Discussed with:  ? ?Anesthesia Plan Comments:   ? ? ? ? ? ? ?Anesthesia Quick Evaluation ? ?

## 2021-07-27 NOTE — Progress Notes (Signed)
Cervix with some change to 4/70/-2. Head better applied after series of position changes. AROM performed in usual fashion with return of clear fluid. ? ?Pitocin dose  now at 24 mU/min, IUPC placed without difficulty.  ? ?FHT cat 1, continue IOL. ?

## 2021-07-27 NOTE — Progress Notes (Signed)
Patient doing well, comfortable with epidural ? ?No on 16 mU/min of pitocin, contracting well.  Cervix with some interval change to 3 cm, but head ballotable. Will recheck soon and AROM when able. ? ?FHT cat 1  ? ?Nilda Simmer MD ?

## 2021-07-28 ENCOUNTER — Encounter (HOSPITAL_COMMUNITY): Payer: Self-pay | Admitting: Obstetrics and Gynecology

## 2021-07-28 LAB — CBC
HCT: 30.5 % — ABNORMAL LOW (ref 36.0–46.0)
Hemoglobin: 10.4 g/dL — ABNORMAL LOW (ref 12.0–15.0)
MCH: 28.6 pg (ref 26.0–34.0)
MCHC: 34.1 g/dL (ref 30.0–36.0)
MCV: 83.8 fL (ref 80.0–100.0)
Platelets: 208 10*3/uL (ref 150–400)
RBC: 3.64 MIL/uL — ABNORMAL LOW (ref 3.87–5.11)
RDW: 15.9 % — ABNORMAL HIGH (ref 11.5–15.5)
WBC: 18.6 10*3/uL — ABNORMAL HIGH (ref 4.0–10.5)
nRBC: 0 % (ref 0.0–0.2)

## 2021-07-28 MED ORDER — ONDANSETRON HCL 4 MG/2ML IJ SOLN: 4.0000 mg | INTRAMUSCULAR | Status: DC | PRN

## 2021-07-28 MED ORDER — TETANUS-DIPHTH-ACELL PERTUSSIS 5-2.5-18.5 LF-MCG/0.5 IM SUSY: 0.5000 mL | PREFILLED_SYRINGE | Freq: Once | INTRAMUSCULAR | Status: DC

## 2021-07-28 MED ORDER — ZOLPIDEM TARTRATE 5 MG PO TABS: 5.0000 mg | ORAL_TABLET | Freq: Every evening | ORAL | Status: DC | PRN

## 2021-07-28 MED ORDER — ONDANSETRON HCL 4 MG PO TABS: 4.0000 mg | ORAL_TABLET | ORAL | Status: DC | PRN

## 2021-07-28 MED ORDER — SIMETHICONE 80 MG PO CHEW: 80.0000 mg | CHEWABLE_TABLET | ORAL | Status: DC | PRN

## 2021-07-28 MED ORDER — ACETAMINOPHEN 325 MG PO TABS
650.0000 mg | ORAL_TABLET | ORAL | Status: DC | PRN
  Administered 2021-07-28 – 2021-07-29 (×6): 650 mg via ORAL
  Filled 2021-07-28 (×6): qty 2

## 2021-07-28 MED ORDER — SENNOSIDES-DOCUSATE SODIUM 8.6-50 MG PO TABS
2.0000 | ORAL_TABLET | ORAL | Status: DC
  Administered 2021-07-29: 2 via ORAL
  Filled 2021-07-28: qty 2

## 2021-07-28 MED ORDER — BENZOCAINE-MENTHOL 20-0.5 % EX AERO
1.0000 "application " | INHALATION_SPRAY | CUTANEOUS | Status: DC | PRN
  Filled 2021-07-28: qty 56

## 2021-07-28 MED ORDER — COCONUT OIL OIL: 1.0000 "application " | TOPICAL_OIL | Status: DC | PRN

## 2021-07-28 MED ORDER — DIBUCAINE (PERIANAL) 1 % EX OINT: 1.0000 "application " | TOPICAL_OINTMENT | CUTANEOUS | Status: DC | PRN

## 2021-07-28 MED ORDER — DIPHENHYDRAMINE HCL 25 MG PO CAPS: 25.0000 mg | ORAL_CAPSULE | Freq: Four times a day (QID) | ORAL | Status: DC | PRN

## 2021-07-28 MED ORDER — HYDROXYZINE HCL 25 MG PO TABS
25.0000 mg | ORAL_TABLET | Freq: Four times a day (QID) | ORAL | Status: DC | PRN
  Administered 2021-07-28: 25 mg via ORAL
  Filled 2021-07-28: qty 1

## 2021-07-28 MED ORDER — WITCH HAZEL-GLYCERIN EX PADS: 1.0000 "application " | MEDICATED_PAD | CUTANEOUS | Status: DC | PRN

## 2021-07-28 MED ORDER — PRENATAL MULTIVITAMIN CH
1.0000 | ORAL_TABLET | Freq: Every day | ORAL | Status: DC
  Administered 2021-07-28 – 2021-07-29 (×2): 1 via ORAL
  Filled 2021-07-28 (×2): qty 1

## 2021-07-28 MED ORDER — IBUPROFEN 600 MG PO TABS
600.0000 mg | ORAL_TABLET | Freq: Four times a day (QID) | ORAL | Status: DC
  Administered 2021-07-28 – 2021-07-29 (×5): 600 mg via ORAL
  Filled 2021-07-28 (×5): qty 1

## 2021-07-28 MED ORDER — HYDROMORPHONE HCL 1 MG/ML IJ SOLN
0.5000 mg | Freq: Once | INTRAMUSCULAR | Status: AC
  Administered 2021-07-28: 0.5 mg via INTRAVENOUS
  Filled 2021-07-28: qty 1

## 2021-07-28 MED ORDER — LABETALOL HCL 200 MG PO TABS
200.0000 mg | ORAL_TABLET | Freq: Two times a day (BID) | ORAL | Status: DC
  Administered 2021-07-28 – 2021-07-29 (×2): 200 mg via ORAL
  Filled 2021-07-28 (×3): qty 1

## 2021-07-28 NOTE — Plan of Care (Signed)
?  Problem: Education: Goal: Knowledge of Childbirth will improve Outcome: Completed/Met Goal: Ability to make informed decisions regarding treatment and plan of care will improve Outcome: Completed/Met Goal: Ability to state and carry out methods to decrease the pain will improve Outcome: Completed/Met Goal: Individualized Educational Video(s) Outcome: Completed/Met   

## 2021-07-28 NOTE — Lactation Note (Signed)
This note was copied from a baby's chart. ?Lactation Consultation Note ?Declined Lactation services. ? ?Patient Name: Teresa Curry ?Today's Date: 07/28/2021 ?  ?Age:37 hours ? ?Maternal Data ?  ? ?Feeding ?  ? ?LATCH Score ?  ? ?  ? ?  ? ?  ? ?  ? ?  ? ? ?Lactation Tools Discussed/Used ?  ? ?Interventions ?  ? ?Discharge ?  ? ?Consult Status ?Consult Status: Complete ? ? ? ?Charyl Dancer ?07/28/2021, 6:23 AM ? ? ? ?

## 2021-07-28 NOTE — Anesthesia Postprocedure Evaluation (Signed)
Anesthesia Post Note ? ?Patient: Teresa Curry ? ?Procedure(s) Performed: AN AD HOC LABOR EPIDURAL ? ?  ? ?Patient location during evaluation: Mother Baby ?Anesthesia Type: Epidural ?Level of consciousness: awake ?Pain management: satisfactory to patient ?Vital Signs Assessment: post-procedure vital signs reviewed and stable ?Respiratory status: spontaneous breathing ?Cardiovascular status: stable ?Anesthetic complications: no ? ? ?No notable events documented. ? ?Last Vitals:  ?Vitals:  ? 07/28/21 0920 07/28/21 1327  ?BP: 126/70 122/62  ?Pulse: (!) 103 99  ?Resp: 17 16  ?Temp: 36.7 ?C 36.7 ?C  ?SpO2:  98%  ?  ?Last Pain:  ?Vitals:  ? 07/28/21 1504  ?TempSrc:   ?PainSc: 10-Worst pain ever  ? ?Pain Goal: Patients Stated Pain Goal: 4 (07/28/21 1504) ? ?  ?  ?  ?  ?  ?  ?  ? ?Anjanette Gilkey ? ? ? ? ?

## 2021-07-28 NOTE — Progress Notes (Signed)
Labor Progress Notes ? ?At time of complete dilation, station remained high, -1.  We have labored down at pit rate of 30 mU/min, then again after practice pushes did not seem to move head whatsoever. ? ?Patient now ~3 hrs of pushing with more progress to +1.  Her energy is low and we discussed possibility of vacuum assistance.  Will try for continued pushing while able for lowest station possible. ? ? ?

## 2021-07-29 LAB — CBC
HCT: 25.3 % — ABNORMAL LOW (ref 36.0–46.0)
Hemoglobin: 8.3 g/dL — ABNORMAL LOW (ref 12.0–15.0)
MCH: 27.9 pg (ref 26.0–34.0)
MCHC: 32.8 g/dL (ref 30.0–36.0)
MCV: 84.9 fL (ref 80.0–100.0)
Platelets: 207 10*3/uL (ref 150–400)
RBC: 2.98 MIL/uL — ABNORMAL LOW (ref 3.87–5.11)
RDW: 16 % — ABNORMAL HIGH (ref 11.5–15.5)
WBC: 12.9 10*3/uL — ABNORMAL HIGH (ref 4.0–10.5)
nRBC: 0 % (ref 0.0–0.2)

## 2021-07-29 MED ORDER — IBUPROFEN 600 MG PO TABS: 600.0000 mg | ORAL_TABLET | Freq: Four times a day (QID) | ORAL | 0 refills | Status: DC | PRN

## 2021-07-29 MED ORDER — FERROUS SULFATE 325 (65 FE) MG PO TABS: 325.0000 mg | ORAL_TABLET | Freq: Every day | ORAL | 3 refills | Status: DC

## 2021-07-29 MED ORDER — HYDROCODONE-ACETAMINOPHEN 5-325 MG PO TABS: 1.0000 | ORAL_TABLET | Freq: Four times a day (QID) | ORAL | 0 refills | Status: DC | PRN

## 2021-07-29 NOTE — Progress Notes (Signed)
Post Partum Day 1 ?Subjective: ?voiding, tolerating PO, + flatus, and muscular soreness in hips ? ?Objective: ?Blood pressure (!) 115/55, pulse 90, temperature 97.9 ?F (36.6 ?C), temperature source Oral, resp. rate 16, height 5\' 3"  (1.6 m), weight 132.6 kg, last menstrual period 10/31/2020, SpO2 99 %, unknown if currently breastfeeding. ? ?Physical Exam:  ?General: alert, cooperative, and no distress ?Lochia: appropriate ?Uterine Fundus: firm ?Incision: healing well ?DVT Evaluation: No evidence of DVT seen on physical exam. ? ?Recent Labs  ?  07/28/21 ?0618 07/29/21 ?0506  ?HGB 10.4* 8.3*  ?HCT 30.5* 25.3*  ? ? ?Assessment/Plan: ?Discharge home ?Wants to go home ?Labetalol 200mg  BID ?BP check in office 1 week ? LOS: 2 days  ? ?07/31/21 II ?07/29/2021, 6:47 AM  ? ? ?

## 2021-07-29 NOTE — Social Work (Signed)
CSW received consult for hx of Anxiety and Depression.  CSW met with MOB to offer support and complete assessment.   ? ?CSW met with MOB at bedside and introduced CSW role. CSW observed MOB up in the room transitioning back to bed to eat breakfast, infant was asleep in the bassinet and FOB present at bedside. MOB presented pleasant and engaged well with CSW. MOB welcomed CSW to complete the assessment with FOB present. CSW inquired how MOB has been feeling since giving birth. MOB reported feeling "quite a bit of pain today but good." MOB shared that the labor and delivery was "a lot harder" in comparison to her first experience. CSW inquired how MOB felt emotionally during the pregnancy. MOB reported she felt physical discomfort but emotionally fine. MOB reported she had anxiety with both pregnancies and PP anxiety with first pregnancy. MOB reported she was diagnosed with anxiety in 2021. MOB reported that she had stress from her in laws that has since been resolved. MOB reported she had anxious thought this pregnancy about "how I will share love between two children".MOB  she is looking forward to go home today so that her daughters can bond. MOB reported she was given Atarax for anxiety symptoms yesterday and stated she has not had to take it today. MOB reported she open to taking medication postpartum, if needed. MOB reported finding support in her spouse, mom and sister. CSW discussed PPD/anxiety. CSW provided education regarding the baby blues period vs. perinatal mood disorders, discussed treatment and gave resources for mental health follow up if concerns arise.  CSW recommended MOB complete a self-evaluation during the postpartum time period using the New Mom Checklist from Postpartum Progress and encouraged MOB to contact a medical professional if symptoms are noted at any time. MOB reported she feels comfortable reaching out to her doctor if she has concerns. CSW assessed MOB for safety. MOB denied  thoughts of harm to self and others.  ? ?MOB reported she has essential items for the infant including a bassinet where the infant will sleep. CSW provided review of Sudden Infant Death Syndrome (SIDS) precautions. MOB and FOB reported understanding. CSW assessed MOB for additional needs. MOB reported no further need.  ? ?CSW identifies no further need for intervention and no barriers to discharge at this time.  ? ? ?Joshia Kitchings, MSW, LCSW ?[facility]  ?Clinical Social Worker  ?[phone] ?07/29/2021  12:20 PM  ?

## 2021-07-29 NOTE — Discharge Summary (Signed)
? ?  Postpartum Discharge Summary ? ?Date of Service updated 07/29/21 ? ?   ?Patient Name: Teresa Curry ?DOB: 1985-06-13 ?MRN: [id number] ? ?Date of admission: 07/27/2021 ?Delivery date:07/28/2021  ?Delivering provider: Eyvonne Mechanic A  ?Date of discharge: 07/29/2021 ? ?Admitting diagnosis: Hypertension in pregnancy [O16.9] ?Intrauterine pregnancy: [redacted]w[redacted]d    ?Secondary diagnosis:  Principal Problem: ?  Hypertension in pregnancy ? ?Additional problems:     ?Discharge diagnosis: Term Pregnancy Delivered                                              ?Post partum procedures: ?Augmentation: AROM, Pitocin, and Cytotec ?Complications: None ? ?Hospital course: Induction of Labor With Vaginal Delivery   ?36y.o. yo G2P2002 at 378w4das admitted to the hospital 07/27/2021 for induction of labor.  Indication for induction: Gestational hypertension.  Patient had an uncomplicated labor course as follows: ?Membrane Rupture Time/Date: 5:55 PM ,07/27/2021   ?Delivery Method:Vaginal, Spontaneous  ?Episiotomy: None  ?Lacerations:  Perineal;1st degree  ?Details of delivery can be found in separate delivery note.  Patient had a routine postpartum course. Patient is discharged home 07/29/21. ? ?Newborn Data: ?Birth date:07/28/2021  ?Birth time:5:30 AM  ?Gender:Female  ?Living status:Living  ?Apgars:7 ,9  ?Weight:3402 g  ? ?Magnesium Sulfate received: No ?BMZ received: No ?Rhophylac:No ?MMR:No ?T-DaP:Given prenatally ?Flu: No ?Transfusion:No ? ?Physical exam  ?Vitals:  ? 07/28/21 1327 07/28/21 1810 07/28/21 2216 07/29/21 0500  ?BP: 122/62 125/67 (!) 120/58 (!) 115/55  ?Pulse: 99 92 93 90  ?Resp: '16 16 17 16  ' ?Temp: 98 ?F (36.7 ?C) 97.8 ?F (36.6 ?C) 97.9 ?F (36.6 ?C) 97.9 ?F (36.6 ?C)  ?TempSrc: Oral Oral Oral Oral  ?SpO2: 98% 98% 99%   ?Weight:      ?Height:      ? ?General: alert, cooperative, and no distress ?Lochia: appropriate ?Uterine Fundus: firm ?Incision: Healing well with no significant drainage ?DVT Evaluation: No evidence of DVT  seen on physical exam. ?Labs: ?Lab Results  ?Component Value Date  ? WBC 12.9 (H) 07/29/2021  ? HGB 8.3 (L) 07/29/2021  ? HCT 25.3 (L) 07/29/2021  ? MCV 84.9 07/29/2021  ? PLT 207 07/29/2021  ? ?CMP Latest Ref Rng & Units 07/13/2021  ?Glucose 70 - 99 mg/dL 111(H)  ?BUN 6 - 20 mg/dL 6  ?Creatinine 0.44 - 1.00 mg/dL 0.55  ?Sodium 135 - 145 mmol/L 136  ?Potassium 3.5 - 5.1 mmol/L 3.8  ?Chloride 98 - 111 mmol/L 106  ?CO2 22 - 32 mmol/L 22  ?Calcium 8.9 - 10.3 mg/dL 8.5(L)  ?Total Protein 6.5 - 8.1 g/dL 6.2(L)  ?Total Bilirubin 0.3 - 1.2 mg/dL <0.1(L)  ?Alkaline Phos 38 - 126 U/L 87  ?AST 15 - 41 U/L 17  ?ALT 0 - 44 U/L 13  ? ?Edinburgh Score: ?Edinburgh Postnatal Depression Scale Screening Tool 07/26/2018  ?I have been able to laugh and see the funny side of things. 0  ?I have looked forward with enjoyment to things. 0  ?I have blamed myself unnecessarily when things went wrong. 1  ?I have been anxious or worried for no good reason. 2  ?I have felt scared or panicky for no good reason. 2  ?Things have been getting on top of me. 1  ?I have been so unhappy that I have had difficulty sleeping. 1  ?I have felt sad or  miserable. 0  ?I have been so unhappy that I have been crying. 1  ?The thought of harming myself has occurred to me. 0  ?Edinburgh Postnatal Depression Scale Total 8  ? ? ? ? ?After visit meds:  ?Allergies as of 07/29/2021   ? ?   Reactions  ? Morphine And Related Itching, Nausea Only  ? Vicodin [hydrocodone-acetaminophen] Itching, Nausea Only  ? Hydrocodone-acetaminophen   ? PT states no reaction last given  ? Morphine Itching  ? ?  ? ?  ?Medication List  ?  ? ?STOP taking these medications   ? ?aspirin EC 81 MG tablet ?  ?calcium carbonate 500 MG chewable tablet ?Commonly known as: TUMS - dosed in mg elemental calcium ?  ?Doxylamine-Pyridoxine 10-10 MG Tbec ?  ?ondansetron 4 MG tablet ?Commonly known as: ZOFRAN ?  ? ?  ? ?TAKE these medications   ? ?acetaminophen 500 MG tablet ?Commonly known as: TYLENOL ?Take  1,000 mg by mouth every 6 (six) hours as needed for mild pain. ?  ?HYDROcodone-acetaminophen 5-325 MG tablet ?Commonly known as: NORCO/VICODIN ?Take 1 tablet by mouth every 6 (six) hours as needed for moderate pain. ?  ?ibuprofen 600 MG tablet ?Commonly known as: ADVIL ?Take 1 tablet (600 mg total) by mouth every 6 (six) hours as needed. ?  ?labetalol 200 MG tablet ?Commonly known as: NORMODYNE ?Take 1 tablet (200 mg total) by mouth 2 (two) times daily. ?  ?prenatal multivitamin Tabs tablet ?Take 1 tablet by mouth at bedtime. ?  ? ?  ? ? ? ?Discharge home in stable condition ?Infant Feeding: Breast ?Infant Disposition:home with mother ?Discharge instruction: per After Visit Summary and Postpartum booklet. ?Activity: Advance as tolerated. Pelvic rest for 6 weeks.  ?Diet: routine diet ?Anticipated Birth Control: Unsure ?Postpartum Appointment:6 weeks ?Additional Postpartum F/U: BP check 1 week ?Future Appointments:No future appointments. ?Follow up Visit: ? ? ?  ? ?07/29/2021 ?Shon Millet II, MD ? ? ?

## 2021-08-08 ENCOUNTER — Telehealth (HOSPITAL_COMMUNITY): Payer: Self-pay | Admitting: *Deleted

## 2021-08-08 NOTE — Telephone Encounter (Signed)
Patient asked how long freshly pumped breast milk is good in the refrigerator. RN informed patient of CDC guidelines for storage of breastmilk - patient verbalized understanding. Patient voiced no other questions or concerns at this time. EPDS=6. Patient voiced no questions or concerns regarding infant at this time. Patient reports infant sleeps in a bassinet on her back. RN reviewed ABCs of safe sleep. Patient verbalized understanding. Patient informed about hospital's virtual postpartum classes and support groups - declined email information at this time. Erline Levine, RN, 08/08/21, [phone]  ?

## 2021-09-12 DIAGNOSIS — Z1389 Encounter for screening for other disorder: Secondary | ICD-10-CM | POA: Diagnosis not present

## 2021-09-12 DIAGNOSIS — F909 Attention-deficit hyperactivity disorder, unspecified type: Secondary | ICD-10-CM | POA: Diagnosis not present

## 2021-09-12 DIAGNOSIS — Z124 Encounter for screening for malignant neoplasm of cervix: Secondary | ICD-10-CM | POA: Diagnosis not present

## 2021-11-08 ENCOUNTER — Ambulatory Visit: Payer: BC Managed Care – PPO | Admitting: Family Medicine

## 2021-11-08 ENCOUNTER — Encounter: Payer: Self-pay | Admitting: Family Medicine

## 2021-11-08 VITALS — BP 124/74 | HR 103 | Temp 97.8°F | Ht 63.0 in | Wt 268.5 lb

## 2021-11-08 DIAGNOSIS — F902 Attention-deficit hyperactivity disorder, combined type: Secondary | ICD-10-CM

## 2021-11-08 MED ORDER — AMPHETAMINE-DEXTROAMPHETAMINE 20 MG PO TABS: 20.0000 mg | ORAL_TABLET | Freq: Two times a day (BID) | ORAL | 0 refills | Status: DC

## 2021-11-17 DIAGNOSIS — Z8679 Personal history of other diseases of the circulatory system: Secondary | ICD-10-CM | POA: Diagnosis not present

## 2021-11-17 DIAGNOSIS — F902 Attention-deficit hyperactivity disorder, combined type: Secondary | ICD-10-CM | POA: Diagnosis not present

## 2021-11-17 DIAGNOSIS — Z862 Personal history of diseases of the blood and blood-forming organs and certain disorders involving the immune mechanism: Secondary | ICD-10-CM | POA: Diagnosis not present

## 2021-11-24 ENCOUNTER — Other Ambulatory Visit: Payer: Self-pay | Admitting: General Surgery

## 2021-11-24 ENCOUNTER — Other Ambulatory Visit (HOSPITAL_COMMUNITY): Payer: Self-pay | Admitting: General Surgery

## 2021-12-01 DIAGNOSIS — F5089 Other specified eating disorder: Secondary | ICD-10-CM | POA: Diagnosis not present

## 2021-12-05 DIAGNOSIS — F5089 Other specified eating disorder: Secondary | ICD-10-CM | POA: Diagnosis not present

## 2021-12-09 ENCOUNTER — Ambulatory Visit (HOSPITAL_COMMUNITY)
Admission: RE | Admit: 2021-12-09 | Discharge: 2021-12-09 | Disposition: A | Discharge status: Home / Self Care | Payer: BC Managed Care – PPO | Source: Ambulatory Visit | Attending: General Surgery | Admitting: General Surgery

## 2021-12-09 ENCOUNTER — Other Ambulatory Visit: Payer: Self-pay

## 2021-12-09 DIAGNOSIS — Z01818 Encounter for other preprocedural examination: Secondary | ICD-10-CM

## 2021-12-09 DIAGNOSIS — K449 Diaphragmatic hernia without obstruction or gangrene: Secondary | ICD-10-CM | POA: Diagnosis not present

## 2021-12-16 ENCOUNTER — Encounter: Payer: BC Managed Care – PPO | Attending: Family Medicine | Admitting: Dietician

## 2021-12-16 ENCOUNTER — Encounter: Payer: Self-pay | Admitting: Dietician

## 2021-12-16 ENCOUNTER — Other Ambulatory Visit: Payer: Self-pay | Admitting: Family Medicine

## 2021-12-16 ENCOUNTER — Telehealth: Payer: Self-pay | Admitting: Family Medicine

## 2021-12-16 DIAGNOSIS — E669 Obesity, unspecified: Secondary | ICD-10-CM | POA: Diagnosis not present

## 2021-12-16 DIAGNOSIS — D539 Nutritional anemia, unspecified: Secondary | ICD-10-CM | POA: Diagnosis not present

## 2021-12-16 MED ORDER — AMPHETAMINE-DEXTROAMPHETAMINE 20 MG PO TABS: 20.0000 mg | ORAL_TABLET | Freq: Two times a day (BID) | ORAL | 0 refills | Status: DC

## 2021-12-16 NOTE — Telephone Encounter (Signed)
   LAST APPOINTMENT DATE:   11/08/21 NP   NEXT APPOINTMENT DATE: 02/04/22 OV with PCP  MEDICATION: amphetamine-dextroamphetamine (ADDERALL) 20 MG tablet [[id number]]    Is the patient out of medication?  Almost, will run out on 12/19/21  PHARMACY: [id number] - [place], [place] - [address]  [address] [place], [place] [address]  Phone:  [phone]  Fax:  [phone]  DEA #:  [id number]

## 2021-12-16 NOTE — Telephone Encounter (Signed)
Please see note below. 

## 2021-12-16 NOTE — Progress Notes (Signed)
Nutrition Assessment for Bariatric Surgery Medical Nutrition Therapy Appt Start Time: 8:00    End Time: 8:59  Patient was seen on 12/16/2021 for Pre-Operative Nutrition Assessment. Letter of approval faxed to [facility] bariatric surgery program coordinator on 12/16/2021.   Referral stated Supervised Weight Loss (SWL) visits needed: 0  Pt completed visits.   Pt has cleared nutrition requirements.   Planned surgery: Sleeve Gastrectomy Pt expectation of surgery: feel more confident and comfortable in her own skin; more energy Pt expectation of dietitian: understanding what to eat, better snack choices    NUTRITION ASSESSMENT   Anthropometrics  Start weight at [facility]: 263.0 lbs (date: 12/16/2021)  Height: 63 in BMI: 46.59 kg/m2     Clinical  Medical hx: ADHD, anxiety, pregnancy induced HTN, IBS, kidney stones Medications: Adderall  Labs: 07/29/2021 (right after child birth in the hospital) Hemoglobin 8.3; HCT 25.3 Notable signs/symptoms: none noted Any previous deficiencies? No  Micronutrient Nutrition Focused Physical Exam: Hair: No issues observed Eyes: No issues observed Mouth: No issues observed Neck: No issues observed Nails: No issues observed Skin: No issues observed  Lifestyle & Dietary Hx  Patient states she lives with husband and two children. The pt and her husband performs the food shopping and the pt prepares the meals. She reports that she typically skips or misses 5 out of 21 possible meals per week. She may have 1 meal per week that are take-out or at a restaurant. 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 She denies binge eating and denies feeling shame and/or guilt after eating too much food.  She denies having used laxatives or vomiting to facilitate weight loss. She admits to emotional eating during times of stress. Since covid and pregnancy pt states she has become more of an emotional eater. She states that she knows the difference  between hunger and thirst and can tell when she is full.  Pt states she is pumping/ breast feeding. Pt states she snacks more on the weekend.  Pt states she has lunch on the weekend, but skips lunch during the work week.  Pt states she stops eating before fullness, stating she does not like the uncomfortable feeling of being full.   24-Hr Dietary Recall First Meal: Primier protein shake and fruit Snack: 100 calorie snack pack Second Meal: skip (during the weekdays) Snack: orange juice, diet ginger ale Third Meal: protein, starch and vegetable Snack: left overs sometimes Beverages: water, orange juice, diet ginger ale   Estimated Energy Needs Calories: 1600   NUTRITION DIAGNOSIS  Overweight/obesity ([place]abun-3.3) related to past poor dietary habits and physical inactivity as evidenced by patient w/ planned Sleeve Gastrectomy surgery following dietary guidelines for continued weight loss.    NUTRITION INTERVENTION  Nutrition counseling (C-1) and education (E-2) to facilitate bariatric surgery goals.  Educated pt on micronutrient deficiencies post surgery and strategies to mitigate that risk   Pre-Op Goals Reviewed with the Patient Track food and beverage intake (pen and paper, MyFitness Pal, Baritastic app, etc.) Make healthy food choices while monitoring portion sizes Consume 3 meals per day or try to eat every 3-5 hours Avoid concentrated sugars and fried foods Keep sugar & fat in the single digits per serving on food labels Practice CHEWING your food (aim for applesauce consistency) Practice not drinking 15 minutes before, during, and 30 minutes after each meal and snack Avoid all carbonated beverages (ex: soda, sparkling beverages)  Limit caffeinated beverages (ex: coffee, tea, energy drinks) Avoid all sugar-sweetened beverages (ex:  regular soda, sports drinks)  Avoid alcohol  Aim for 64-100 ounces of FLUID daily (with at least half of fluid intake being plain water)  Aim for at  least 60-80 grams of PROTEIN daily Look for a liquid protein source that contains ?15 g protein and ?5 g carbohydrate (ex: shakes, drinks, shots) Make a list of non-food related activities Physical activity is an important part of a healthy lifestyle so keep it moving! The goal is to reach 150 minutes of exercise per week, including cardiovascular and weight baring activity.  *Goals that are bolded indicate the pt would like to start working towards these  Handouts Provided Include  Bariatric Surgery handouts (Nutrition Visits, Pre-Op Goals, Protein Shakes, Vitamins & Minerals)  Learning Style & Readiness for Change Teaching method utilized: Visual & Auditory  Demonstrated degree of understanding via: Teach Back  Readiness Level: preparation Barriers to learning/adherence to lifestyle change: nothing identified  RD's Notes for Next Visit    MONITORING & EVALUATION Dietary intake, weekly physical activity, body weight, and pre-op goals reached at next nutrition visit.    Next Steps  Pt has completed visits. No further supervised visits required/recomended  Patient is to follow up at [facility] for Pre-Op Class >2 weeks before surgery for further nutrition education.

## 2021-12-26 ENCOUNTER — Ambulatory Visit: Payer: BC Managed Care – PPO | Admitting: Dietician

## 2022-01-03 ENCOUNTER — Other Ambulatory Visit: Payer: Self-pay

## 2022-01-03 ENCOUNTER — Emergency Department (HOSPITAL_BASED_OUTPATIENT_CLINIC_OR_DEPARTMENT_OTHER)
Admission: EM | Admit: 2022-01-03 | Discharge: 2022-01-03 | Disposition: A | Discharge status: Home / Self Care | Payer: BC Managed Care – PPO | Attending: Emergency Medicine | Admitting: Emergency Medicine

## 2022-01-03 ENCOUNTER — Encounter (HOSPITAL_BASED_OUTPATIENT_CLINIC_OR_DEPARTMENT_OTHER): Payer: Self-pay | Admitting: Pharmacy Technician

## 2022-01-03 ENCOUNTER — Emergency Department (HOSPITAL_BASED_OUTPATIENT_CLINIC_OR_DEPARTMENT_OTHER): Payer: BC Managed Care – PPO

## 2022-01-03 DIAGNOSIS — R519 Headache, unspecified: Secondary | ICD-10-CM | POA: Diagnosis not present

## 2022-01-03 DIAGNOSIS — G43809 Other migraine, not intractable, without status migrainosus: Secondary | ICD-10-CM | POA: Insufficient documentation

## 2022-01-03 LAB — CBC
HCT: 37.7 % (ref 36.0–46.0)
Hemoglobin: 12.4 g/dL (ref 12.0–15.0)
MCH: 26 pg (ref 26.0–34.0)
MCHC: 32.9 g/dL (ref 30.0–36.0)
MCV: 79 fL — ABNORMAL LOW (ref 80.0–100.0)
Platelets: 295 10*3/uL (ref 150–400)
RBC: 4.77 MIL/uL (ref 3.87–5.11)
RDW: 14.8 % (ref 11.5–15.5)
WBC: 10.8 10*3/uL — ABNORMAL HIGH (ref 4.0–10.5)
nRBC: 0 % (ref 0.0–0.2)

## 2022-01-03 LAB — BASIC METABOLIC PANEL
Anion gap: 8 (ref 5–15)
BUN: 14 mg/dL (ref 6–20)
CO2: 28 mmol/L (ref 22–32)
Calcium: 9.2 mg/dL (ref 8.9–10.3)
Chloride: 103 mmol/L (ref 98–111)
Creatinine, Ser: 0.59 mg/dL (ref 0.44–1.00)
GFR, Estimated: >60 mL/min (ref >60)
Glucose, Bld: 103 mg/dL — ABNORMAL HIGH (ref 70–99)
Potassium: 4.3 mmol/L (ref 3.5–5.1)
Sodium: 139 mmol/L (ref 135–145)

## 2022-01-03 MED ORDER — PROCHLORPERAZINE EDISYLATE 10 MG/2ML IJ SOLN
10.0000 mg | Freq: Once | INTRAMUSCULAR | Status: AC
  Administered 2022-01-03: 10 mg via INTRAVENOUS
  Filled 2022-01-03: qty 2

## 2022-01-03 MED ORDER — FENTANYL CITRATE PF 50 MCG/ML IJ SOSY
50.0000 ug | PREFILLED_SYRINGE | Freq: Once | INTRAMUSCULAR | Status: AC
  Administered 2022-01-03: 50 ug via INTRAVENOUS
  Filled 2022-01-03: qty 1

## 2022-01-03 NOTE — ED Provider Notes (Signed)
[facility] EMERGENCY DEPT Provider Note   CSN: [id number] Arrival date & time: 01/03/22  1729     History {Add pertinent medical, surgical, social history, OB history to HPI:1} Chief Complaint  Patient presents with   Headache    Teresa Curry is a 36 y.o. female.   Headache  Patient presents to the ER for evaluation of a headache.  Patient does have a history of kidney stones IBS, anxiety and ADHD.  She states she does not generally have history of headaches although has had some increased stress recently has had some mild headache.  However today she had more intense sudden onset of headache around 3:00 in the afternoon.  Mostly on the left side of her head.  She has had some nausea and photophobia but no vomiting.  No fevers.  No neck pain or stiffness.  No numbness or weakness    Home Medications Prior to Admission medications   Medication Sig Start Date End Date Taking? Authorizing Provider  amphetamine-dextroamphetamine (ADDERALL) 20 MG tablet Take 1 tablet every day by oral route.    [provider]  amphetamine-dextroamphetamine (ADDERALL) 20 MG tablet Take 1 tablet (20 mg total) by mouth 2 (two) times daily. 12/16/21   Jeani Sow, MD      Allergies    Morphine and related, Vicodin [hydrocodone-acetaminophen], Hydrocodone-acetaminophen, and Morphine    Review of Systems   Review of Systems  Neurological:  Positive for headaches.    Physical Exam Updated Vital Signs BP (!) 134/90 (BP Location: Right Arm)   Pulse 91   Temp (!) 97.5 F (36.4 C)   Resp 18   LMP 10/11/2020 (Approximate) Comment: had baby four months ago  SpO2 98%  Physical Exam Vitals and nursing note reviewed.  Constitutional:      Appearance: She is well-developed. She is not diaphoretic.     Comments: Patient is wearing sunglasses  HENT:     Head: Normocephalic and atraumatic.     Right Ear: External ear normal.     Left Ear: External ear normal.  Eyes:      General: No scleral icterus.       Right eye: No discharge.        Left eye: No discharge.     Conjunctiva/sclera: Conjunctivae normal.  Neck:     Trachea: No tracheal deviation.  Cardiovascular:     Rate and Rhythm: Normal rate and regular rhythm.  Pulmonary:     Effort: Pulmonary effort is normal. No respiratory distress.     Breath sounds: Normal breath sounds. No stridor. No wheezing or rales.  Abdominal:     General: Bowel sounds are normal. There is no distension.     Palpations: Abdomen is soft.     Tenderness: There is no abdominal tenderness. There is no guarding or rebound.  Musculoskeletal:        General: No tenderness or deformity.     Cervical back: Neck supple.  Skin:    General: Skin is warm and dry.     Findings: No rash.  Neurological:     General: No focal deficit present.     Mental Status: She is alert.     Cranial Nerves: No cranial nerve deficit (no facial droop, extraocular movements intact, no slurred speech).     Sensory: No sensory deficit.     Motor: No abnormal muscle tone or seizure activity.     Coordination: Coordination normal.  Psychiatric:  Mood and Affect: Mood normal.     ED Results / Procedures / Treatments   Labs (all labs ordered are listed, but only abnormal results are displayed) Labs Reviewed  CBC  BASIC METABOLIC PANEL  HCG, SERUM, QUALITATIVE    EKG None  Radiology No results found.  Procedures Procedures  {Document cardiac monitor, telemetry assessment procedure when appropriate:1}  Medications Ordered in ED Medications  prochlorperazine (COMPAZINE) injection 10 mg (has no administration in time range)  fentaNYL (SUBLIMAZE) injection 50 mcg (has no administration in time range)    ED Course/ Medical Decision Making/ A&P                           Medical Decision Making Amount and/or Complexity of Data Reviewed Labs: ordered. Radiology: ordered.  Risk Prescription drug  management.   ***  {Document critical care time when appropriate:1} {Document review of labs and clinical decision tools ie heart score, Chads2Vasc2 etc:1}  {Document your independent review of radiology images, and any outside records:1} {Document your discussion with family members, caretakers, and with consultants:1} {Document social determinants of health affecting pt's care:1} {Document your decision making why or why not admission, treatments were needed:1} Final Clinical Impression(s) / ED Diagnoses Final diagnoses:  None    Rx / DC Orders ED Discharge Orders     None

## 2022-01-03 NOTE — Discharge Instructions (Addendum)
Try taking over-the-counter Tylenol or Excedrin Migraine as needed for headaches in the future.  There are also prescription medications if that does not work.  Pump and dump for 24 hours after the fentanyl just to be safe.

## 2022-01-03 NOTE — ED Notes (Signed)
Pt requesting to leave. Stating she is feeling much better. Pain rated 0/10. Provider aware.

## 2022-01-03 NOTE — ED Triage Notes (Signed)
Pt here with reports of headache onset today with sudden worsening around 1530. Pt reports emesis due to the pain. Denies hx of migraines.

## 2022-01-03 NOTE — ED Notes (Signed)
Discharge paperwork given and verbally understood. 

## 2022-01-31 ENCOUNTER — Ambulatory Visit: Payer: BC Managed Care – PPO | Admitting: Skilled Nursing Facility1

## 2022-02-06 ENCOUNTER — Encounter: Payer: BC Managed Care – PPO | Attending: Family Medicine | Admitting: Skilled Nursing Facility1

## 2022-02-06 ENCOUNTER — Encounter: Payer: Self-pay | Admitting: Skilled Nursing Facility1

## 2022-02-06 ENCOUNTER — Encounter: Payer: Self-pay | Admitting: *Deleted

## 2022-02-06 DIAGNOSIS — E669 Obesity, unspecified: Secondary | ICD-10-CM

## 2022-02-06 NOTE — Progress Notes (Addendum)
Pre-Operative Nutrition Class:    Patient was seen on 02/06/2022 for Pre-Operative Bariatric Surgery Education at [facility].    Pt is currently breast feeding and would like to continue after surgery so dietitian advised a higher calorie and protein recommendation including 80 grams of protein and including diluted 100% fruit juice, no sugar added applesauce, plain mashed potatoes  Surgery date: 03/20/2022 Surgery type: sleeve Start weight at [facility]: 263 Weight today: 261.3  Samples given per MNT protocol. Patient educated on appropriate usage:  Bariatric Advantage Multivitamin Lot # [id number] Exp: 08/24   Bariatric Advantage Calcium  Lot # [id number] Exp: 08/24/2022   Protein Shake Lot # [id number] / [id number] Exp: 12 Feb 2022 /  15 May 2022  The following the learning objectives were met by the patient during this course: Identify Pre-Op Dietary Goals and will begin 2 weeks pre-operatively Identify appropriate sources of fluids and proteins  State protein recommendations and appropriate sources pre and post-operatively Identify Post-Operative Dietary Goals and will follow for 2 weeks post-operatively Identify appropriate multivitamin and calcium sources Describe the need for physical activity post-operatively and will follow MD recommendations State when to call healthcare provider regarding medication questions or post-operative complications When having a diagnosis of diabetes understanding hypoglycemia symptoms and the inclusion of 1 complex carbohydrate per meal  Handouts given during class include: Pre-Op Bariatric Surgery Diet Handout Protein Shake Handout Post-Op Bariatric Surgery Nutrition Handout BELT Program Information Flyer Support Group Information Flyer [facility] Bariatric Supplements Price List  Follow-Up Plan: Patient will follow-up at [facility] 2 weeks post operatively for diet advancement per MD.  

## 2022-02-08 ENCOUNTER — Encounter: Payer: Self-pay | Admitting: Family Medicine

## 2022-02-08 ENCOUNTER — Ambulatory Visit: Payer: BC Managed Care – PPO | Admitting: Family Medicine

## 2022-02-08 VITALS — BP 112/70 | HR 85 | Temp 97.6°F | Ht 63.0 in | Wt 258.2 lb

## 2022-02-08 DIAGNOSIS — F902 Attention-deficit hyperactivity disorder, combined type: Secondary | ICD-10-CM

## 2022-02-08 DIAGNOSIS — R42 Dizziness and giddiness: Secondary | ICD-10-CM | POA: Diagnosis not present

## 2022-02-08 MED ORDER — AMPHETAMINE-DEXTROAMPHETAMINE 20 MG PO TABS: 20.0000 mg | ORAL_TABLET | Freq: Every day | ORAL | 0 refills | Status: DC

## 2022-02-08 NOTE — Progress Notes (Signed)
Subjective:     Patient ID: Teresa Curry, female    DOB: 09/17/1985, 36 y.o.   MRN: [id number]  Chief Complaint  Patient presents with   Follow-up    3 month follow-up for ADD     HPI  ADD-last filled adderall 11/11/21 per PDMP aware. Doesn't have time to get to pharmacy. Takes 20mg  in am and 10-20 in afternoon. Skips weekends. No SI Obesity-will be getting gastric sleeve 11/6  There are no preventive care reminders to display for this patient.   Past Medical History:  Diagnosis Date   ADHD    Anxiety    IBS (irritable bowel syndrome)    Kidney stones    Pregnancy induced hypertension     Past Surgical History:  Procedure Laterality Date   WISDOM TOOTH EXTRACTION      Outpatient Medications Prior to Visit  Medication Sig Dispense Refill   amphetamine-dextroamphetamine (ADDERALL) 20 MG tablet Take 1 tablet (20 mg total) by mouth 2 (two) times daily. 60 tablet 0   amphetamine-dextroamphetamine (ADDERALL) 20 MG tablet Take 1 tablet every day by oral route.     No facility-administered medications prior to visit.    Allergies  Allergen Reactions   Morphine And Related Itching and Nausea Only   Vicodin [Hydrocodone-Acetaminophen] Itching and Nausea Only   Hydrocodone-Acetaminophen Other (See Comments)    PT states no reaction last given    Morphine Itching and Other (See Comments)   ROS neg/noncontributory except as noted HPI/below Some dizziness at times.  Some sob at times-more when active but can exercise fine and some days, just walking to bathroom-for past wk.  Slight cough as well. .  Had migraine so went to [facility] of head.       Objective:     BP 112/70   Pulse 85   Temp 97.6 F (36.4 C) (Temporal)   Ht 5\' 3"  (1.6 m)   Wt 258 lb 4 oz (117.1 kg)   SpO2 97%   Breastfeeding Yes Comment: still pumping  BMI 45.75 kg/m  Wt Readings from Last 3 Encounters:  02/08/22 258 lb 4 oz (117.1 kg)  02/06/22 261 lb 4.8 oz (118.5 kg)  12/16/21 263 lb (119.3  kg)    Physical Exam   Gen: WDWN NAD HEENT: NCAT, conjunctiva not injected, sclera nonicteric TM WNL B, OP moist, no exudates  NECK:  supple, no thyromegaly, no nodes, no carotid bruits CARDIAC: RRR, S1S2+, no murmur. DP 2+B LUNGS: CTAB. No wheezes EXT:  no edema MSK: no gross abnormalities.  NEURO: A&O x3.  CN II-XII intact.  PSYCH: normal mood. Good eye contact     Assessment & Plan:   Problem List Items Addressed This Visit       Other   Attention deficit hyperactivity disorder (ADHD), combined type - Primary   Other Visit Diagnoses     Dizziness          ADD-chronic.  Controlled when on meds.  Continue adderall 20mg  bid.  F/u 3 mo Dizziness/intermitt SOB-suspect viral.  Monitor.  If continues, f/u  Meds ordered this encounter  Medications   amphetamine-dextroamphetamine (ADDERALL) 20 MG tablet    Sig: Take 1 tablet (20 mg total) by mouth daily before breakfast.    Dispense:  30 tablet    Refill:  0   amphetamine-dextroamphetamine (ADDERALL) 20 MG tablet    Sig: Take 1 tablet (20 mg total) by mouth daily before breakfast.    Dispense:  30 tablet  Refill:  0   amphetamine-dextroamphetamine (ADDERALL) 20 MG tablet    Sig: Take 1 tablet (20 mg total) by mouth daily before breakfast.    Dispense:  30 tablet    Refill:  0    Angelena Sole, MD

## 2022-02-08 NOTE — Patient Instructions (Signed)
It was very nice to see you today!  Good luck w/surgery.   Let me know if not feeling better next wk   PLEASE NOTE:  If you had any lab tests please let us know if you have not heard back within a few days. You may see your results on MyChart before we have a chance to review them but we will give you a call once they are reviewed by [place]. If we ordered any referrals today, please let us know if you have not heard from their office within the next week.   Please try these tips to maintain a healthy lifestyle:  Eat most of your calories during the day when you are active. Eliminate processed foods including packaged sweets (pies, cakes, cookies), reduce intake of potatoes, white bread, white pasta, and white rice. Look for whole grain options, oat flour or almond flour.  Each meal should contain half fruits/vegetables, one quarter protein, and one quarter carbs (no bigger than a computer mouse).  Cut down on sweet beverages. This includes juice, soda, and sweet tea. Also watch fruit intake, though this is a healthier sweet option, it still contains natural sugar! Limit to 3 servings daily.  Drink at least 1 glass of water with each meal and aim for at least 8 glasses per day  Exercise at least 150 minutes every week.

## 2022-03-08 NOTE — Progress Notes (Signed)
Sent message, via epic in basket, requesting orders in epic from surgeon.  

## 2022-03-09 DIAGNOSIS — F902 Attention-deficit hyperactivity disorder, combined type: Secondary | ICD-10-CM | POA: Diagnosis not present

## 2022-03-09 DIAGNOSIS — K449 Diaphragmatic hernia without obstruction or gangrene: Secondary | ICD-10-CM | POA: Diagnosis not present

## 2022-03-09 DIAGNOSIS — M25561 Pain in right knee: Secondary | ICD-10-CM | POA: Diagnosis not present

## 2022-03-10 ENCOUNTER — Ambulatory Visit: Payer: Self-pay | Admitting: General Surgery

## 2022-03-14 NOTE — Progress Notes (Signed)
COVID Vaccine Completed:  Date of COVID positive in last 90 days:  PCP - Tawnya Crook, MD Cardiologist -   Chest x-ray - 12/09/21 Epic EKG - 12/09/21 Epic Stress Test -  ECHO -  Cardiac Cath -  Pacemaker/ICD device last checked: Spinal Cord Stimulator:  Bowel Prep - no solids after 6pm night before  Sleep Study -  CPAP -   Fasting Blood Sugar -  Checks Blood Sugar _____ times a day  Blood Thinner Instructions: Aspirin Instructions: Last Dose:  Activity level:  Can go up a flight of stairs and perform activities of daily living without stopping and without symptoms of chest pain or shortness of breath.  Able to exercise without symptoms  Unable to go up a flight of stairs without symptoms of     Anesthesia review:   Patient denies shortness of breath, fever, cough and chest pain at PAT appointment  Patient verbalized understanding of instructions that were given to them at the PAT appointment. Patient was also instructed that they will need to review over the PAT instructions again at home before surgery.

## 2022-03-14 NOTE — Patient Instructions (Signed)
SURGICAL WAITING ROOM VISITATION Patients having surgery or a procedure may have no more than 2 support people in the waiting area - these visitors may rotate.   Children under the age of 40 must have an adult with them who is not the patient. If the patient needs to stay at the hospital during part of their recovery, the visitor guidelines for inpatient rooms apply. Pre-op nurse will coordinate an appropriate time for 1 support person to accompany patient in pre-op.  This support person may not rotate.    Please refer to the [facility] website for the visitor guidelines for Inpatients (after your surgery is over and you are in a regular room).    Your procedure is scheduled on: 03/20/22   Report to [facility] Main Entrance    Report to admitting at 5:15 AM   Call this number if you have problems the morning of surgery [phone]   MORNING OF SURGERY DRINK:   DRINK 1 G2 drink BEFORE YOU LEAVE HOME, DRINK ALL OF THE  G2 DRINK AT ONE TIME.   NO SOLID FOOD AFTER 600 PM THE NIGHT BEFORE YOUR SURGERY. YOU MAY DRINK CLEAR FLUIDS. THE G2 DRINK YOU DRINK BEFORE YOU LEAVE HOME WILL BE THE LAST FLUIDS YOU DRINK BEFORE SURGERY.  PAIN IS EXPECTED AFTER SURGERY AND WILL NOT BE COMPLETELY ELIMINATED. AMBULATION AND TYLENOL WILL HELP REDUCE INCISIONAL AND GAS PAIN. MOVEMENT IS KEY!  YOU ARE EXPECTED TO BE OUT OF BED WITHIN 4 HOURS OF ADMISSION TO YOUR PATIENT ROOM.  SITTING IN THE RECLINER THROUGHOUT THE DAY IS IMPORTANT FOR DRINKING FLUIDS AND MOVING GAS THROUGHOUT THE GI TRACT.  COMPRESSION STOCKINGS SHOULD BE WORN [place] UNLESS YOU ARE WALKING.   INCENTIVE SPIROMETER SHOULD BE USED EVERY HOUR WHILE AWAKE TO DECREASE POST-OPERATIVE COMPLICATIONS SUCH AS PNEUMONIA.  WHEN DISCHARGED HOME, IT IS IMPORTANT TO CONTINUE TO WALK EVERY HOUR AND USE THE INCENTIVE SPIROMETER EVERY HOUR.    You may have the following liquids until 4:30 AM DAY OF SURGERY  Water Non-Citrus  Juices (without pulp, NO RED) Carbonated Beverages Black Coffee (NO MILK/CREAM OR CREAMERS, sugar ok)  Clear Tea (NO MILK/CREAM OR CREAMERS, sugar ok) regular and decaf                             Plain Jell-O (NO RED)                                           Fruit ices (not with fruit pulp, NO RED)                                     Popsicles (NO RED)                                                               Sports drinks like Gatorade (NO RED)               The day of surgery:  Drink ONE (1) Pre-Surgery G2 at 4:30 AM the morning of  surgery. Drink in one sitting. Do not sip.  This drink was given to you during your hospital  pre-op appointment visit. Nothing else to drink after completing the  Pre-Surgery G2.          If you have questions, please contact your surgeon's office.   FOLLOW BOWEL PREP AND ANY ADDITIONAL PRE OP INSTRUCTIONS YOU RECEIVED FROM YOUR SURGEON'S OFFICE!!!     Oral Hygiene is also important to reduce your risk of infection.                                    Remember - BRUSH YOUR TEETH THE MORNING OF SURGERY WITH YOUR REGULAR TOOTHPASTE   Take these medicines the morning of surgery with A SIP OF WATER: None                               You may not have any metal on your body including hair pins, jewelry, and body piercing             Do not wear make-up, lotions, powders, perfumes, or deodorant  Do not wear nail polish including gel and S&S, artificial/acrylic nails, or any other type of covering on natural nails including finger and toenails. If you have artificial nails, gel coating, etc. that needs to be removed by a nail salon please have this removed prior to surgery or surgery may need to be canceled/ delayed if the surgeon/ anesthesia feels like they are unable to be safely monitored.   Do not shave  48 hours prior to surgery.    Do not bring valuables to the hospital. [place].   Bring small  overnight bag day of surgery.   DO NOT [place]. PHARMACY WILL DISPENSE MEDICATIONS LISTED ON YOUR MEDICATION LIST TO YOU DURING YOUR ADMISSION [place]!              Please read over the following fact sheets you were given: IF [place] [name]    If you received a COVID test during your pre-op visit  it is requested that you wear a mask when out in public, stay away from anyone that may not be feeling well and notify your surgeon if you develop symptoms. If you test positive for Covid or have been in contact with anyone that has tested positive in the last 10 days please notify you surgeon.     [place] - Preparing for Surgery Before surgery, you can play an important role.  Because skin is not sterile, your skin needs to be as free of germs as possible.  You can reduce the number of germs on your skin by washing with CHG (chlorahexidine gluconate) soap before surgery.  CHG is an antiseptic cleaner which kills germs and bonds with the skin to continue killing germs even after washing. Please DO NOT use if you have an allergy to CHG or antibacterial soaps.  If your skin becomes reddened/irritated stop using the CHG and inform your nurse when you arrive at Short Stay. Do not shave (including legs and underarms) for at least 48 hours prior to the first CHG shower.  You may shave your  face/neck.  Please follow these instructions carefully:  1.  Shower with CHG Soap the night before surgery and the  morning of surgery.  2.  If you choose to wash your hair, wash your hair first as usual with your normal  shampoo.  3.  After you shampoo, rinse your hair and body thoroughly to remove the shampoo.                             4.  Use CHG as you would any other liquid soap.  You can apply chg directly to the skin and wash.  Gently with a scrungie or clean washcloth.  5.  Apply the CHG Soap to  your body ONLY FROM THE NECK DOWN.   Do   not use on face/ open                           Wound or open sores. Avoid contact with eyes, ears mouth and   genitals (private parts).                       Wash face,  Genitals (private parts) with your normal soap.             6.  Wash thoroughly, paying special attention to the area where your    surgery  will be performed.  7.  Thoroughly rinse your body with warm water from the neck down.  8.  DO NOT shower/wash with your normal soap after using and rinsing off the CHG Soap.                9.  Pat yourself dry with a clean towel.            10.  Wear clean pajamas.            11.  Place clean sheets on your bed the night of your first shower and do not  sleep with pets. Day of Surgery : Do not apply any lotions/deodorants the morning of surgery.  Please wear clean clothes to the hospital/surgery center.  FAILURE TO FOLLOW THESE INSTRUCTIONS MAY RESULT IN THE CANCELLATION OF YOUR SURGERY  PATIENT SIGNATURE_________________________________  NURSE SIGNATURE__________________________________  ________________________________________________________________________   Teresa Curry  An incentive spirometer is a tool that can help keep your lungs clear and active. This tool measures how well you are filling your lungs with each breath. Taking long deep breaths may help reverse or decrease the chance of developing breathing (pulmonary) problems (especially infection) following: A long period of time when you are unable to move or be active. BEFORE THE PROCEDURE  If the spirometer includes an indicator to show your best effort, your nurse or respiratory therapist will set it to a desired goal. If possible, sit up straight or lean slightly forward. Try not to slouch. Hold the incentive spirometer in an upright position. INSTRUCTIONS FOR USE  Sit on the edge of your bed if possible, or sit up as far as you can in bed or on a chair. Hold the  incentive spirometer in an upright position. Breathe out normally. Place the mouthpiece in your mouth and seal your lips tightly around it. Breathe in slowly and as deeply as possible, raising the piston or the ball toward the top of the column. Hold your breath for 3-5 seconds or for as long as possible. Allow the piston  or ball to fall to the bottom of the column. Remove the mouthpiece from your mouth and breathe out normally. Rest for a few seconds and repeat Steps 1 through 7 at least 10 times every 1-2 hours when you are awake. Take your time and take a few normal breaths between deep breaths. The spirometer may include an indicator to show your best effort. Use the indicator as a goal to work toward during each repetition. After each set of 10 deep breaths, practice coughing to be sure your lungs are clear. If you have an incision (the cut made at the time of surgery), support your incision when coughing by placing a pillow or rolled up towels firmly against it. Once you are able to get out of bed, walk around indoors and cough well. You may stop using the incentive spirometer when instructed by your caregiver.  RISKS AND COMPLICATIONS Take your time so you do not get dizzy or light-headed. If you are in pain, you may need to take or ask for pain medication before doing incentive spirometry. It is harder to take a deep breath if you are having pain. AFTER USE Rest and breathe slowly and easily. It can be helpful to keep track of a log of your progress. Your caregiver can provide you with a simple table to help with this. If you are using the spirometer at home, follow these instructions: [place] IF:  You are having difficultly using the spirometer. You have trouble using the spirometer as often as instructed. Your pain medication is not giving enough relief while using the spirometer. You develop fever of 100.5 F (38.1 C) or higher. SEEK IMMEDIATE MEDICAL CARE IF:  You cough  up bloody sputum that had not been present before. You develop fever of 102 F (38.9 C) or greater. You develop worsening pain at or near the incision site. MAKE SURE YOU:  Understand these instructions. Will watch your condition. Will get help right away if you are not doing well or get worse. Document Released: 09/11/2006 Document Revised: 07/24/2011 Document Reviewed: 11/12/2006 Exit[employer] Patient Information 2014 ExitCare, [place].   ________________________________________________________________________  WHAT IS A BLOOD TRANSFUSION? Blood Transfusion Information  A transfusion is the replacement of blood or some of its parts. Blood is made up of multiple cells which provide different functions. Red blood cells carry oxygen and are used for blood loss replacement. White blood cells fight against infection. Platelets control bleeding. Plasma helps clot blood. Other blood products are available for specialized needs, such as hemophilia or other clotting disorders. BEFORE THE TRANSFUSION  Who gives blood for transfusions?  Healthy volunteers who are fully evaluated to make sure their blood is safe. This is blood bank blood. Transfusion therapy is the safest it has ever been in the practice of medicine. Before blood is taken from a donor, a complete history is taken to make sure that person has no history of diseases nor engages in risky social behavior (examples are intravenous drug use or sexual activity with multiple partners). The donor's travel history is screened to minimize risk of transmitting infections, such as malaria. The donated blood is tested for signs of infectious diseases, such as HIV and hepatitis. The blood is then tested to be sure it is compatible with you in order to minimize the chance of a transfusion reaction. If you or a relative donates blood, this is often done in anticipation of surgery and is not appropriate for emergency situations. It takes many days to  process  the donated blood. RISKS AND COMPLICATIONS Although transfusion therapy is very safe and saves many lives, the main dangers of transfusion include:  Getting an infectious disease. Developing a transfusion reaction. This is an allergic reaction to something in the blood you were given. Every precaution is taken to prevent this. The decision to have a blood transfusion has been considered carefully by your caregiver before blood is given. Blood is not given unless the benefits outweigh the risks. AFTER THE TRANSFUSION Right after receiving a blood transfusion, you will usually feel much better and more energetic. This is especially true if your red blood cells have gotten low (anemic). The transfusion raises the level of the red blood cells which carry oxygen, and this usually causes an energy increase. The nurse administering the transfusion will monitor you carefully for complications. HOME CARE INSTRUCTIONS  No special instructions are needed after a transfusion. You may find your energy is better. Speak with your caregiver about any limitations on activity for underlying diseases you may have. SEEK MEDICAL CARE IF:  Your condition is not improving after your transfusion. You develop redness or irritation at the intravenous (IV) site. SEEK IMMEDIATE MEDICAL CARE IF:  Any of the following symptoms occur over the next 12 hours: Shaking chills. You have a temperature by mouth above 102 F (38.9 C), not controlled by medicine. Chest, back, or muscle pain. People around you feel you are not acting correctly or are confused. Shortness of breath or difficulty breathing. Dizziness and fainting. You get a rash or develop hives. You have a decrease in urine output. Your urine turns a dark color or changes to pink, red, or brown. Any of the following symptoms occur over the next 10 days: You have a temperature by mouth above 102 F (38.9 C), not controlled by medicine. Shortness of  breath. Weakness after normal activity. The white part of the eye turns yellow (jaundice). You have a decrease in the amount of urine or are urinating less often. Your urine turns a dark color or changes to pink, red, or brown. Document Released: 04/28/2000 Document Revised: 07/24/2011 Document Reviewed: 12/16/2007 [employer] Patient Information 2014 [place], [place].  _______________________________________________________________________

## 2022-03-15 ENCOUNTER — Encounter (HOSPITAL_COMMUNITY): Payer: Self-pay

## 2022-03-15 ENCOUNTER — Encounter (HOSPITAL_COMMUNITY)
Admission: RE | Admit: 2022-03-15 | Discharge: 2022-03-15 | Disposition: A | Discharge status: Home / Self Care | Payer: BC Managed Care – PPO | Source: Ambulatory Visit | Attending: General Surgery | Admitting: General Surgery

## 2022-03-15 DIAGNOSIS — Z01818 Encounter for other preprocedural examination: Secondary | ICD-10-CM | POA: Insufficient documentation

## 2022-03-15 HISTORY — DX: Anemia, unspecified: D64.9

## 2022-03-15 HISTORY — DX: Headache, unspecified: R51.9

## 2022-03-15 LAB — CBC WITH DIFFERENTIAL/PLATELET
Abs Immature Granulocytes: 0.03 10*3/uL (ref 0.00–0.07)
Basophils Absolute: 0 10*3/uL (ref 0.0–0.1)
Basophils Relative: 0 %
Eosinophils Absolute: 0.1 10*3/uL (ref 0.0–0.5)
Eosinophils Relative: 2 %
HCT: 40.8 % (ref 36.0–46.0)
Hemoglobin: 13.3 g/dL (ref 12.0–15.0)
Immature Granulocytes: 0 %
Lymphocytes Relative: 19 %
Lymphs Abs: 1.4 10*3/uL (ref 0.7–4.0)
MCH: 27.1 pg (ref 26.0–34.0)
MCHC: 32.6 g/dL (ref 30.0–36.0)
MCV: 83.3 fL (ref 80.0–100.0)
Monocytes Absolute: 0.6 10*3/uL (ref 0.1–1.0)
Monocytes Relative: 8 %
Neutro Abs: 5.2 10*3/uL (ref 1.7–7.7)
Neutrophils Relative %: 71 %
Platelets: 290 10*3/uL (ref 150–400)
RBC: 4.9 MIL/uL (ref 3.87–5.11)
RDW: 15 % (ref 11.5–15.5)
WBC: 7.4 10*3/uL (ref 4.0–10.5)
nRBC: 0 % (ref 0.0–0.2)

## 2022-03-15 LAB — COMPREHENSIVE METABOLIC PANEL
ALT: 16 U/L (ref 0–44)
AST: 17 U/L (ref 15–41)
Albumin: 3.9 g/dL (ref 3.5–5.0)
Alkaline Phosphatase: 85 U/L (ref 38–126)
Anion gap: 7 (ref 5–15)
BUN: 13 mg/dL (ref 6–20)
CO2: 23 mmol/L (ref 22–32)
Calcium: 9 mg/dL (ref 8.9–10.3)
Chloride: 108 mmol/L (ref 98–111)
Creatinine, Ser: 0.57 mg/dL (ref 0.44–1.00)
GFR, Estimated: >60 mL/min (ref >60)
Glucose, Bld: 81 mg/dL (ref 70–99)
Potassium: 4.1 mmol/L (ref 3.5–5.1)
Sodium: 138 mmol/L (ref 135–145)
Total Bilirubin: 0.6 mg/dL (ref 0.3–1.2)
Total Protein: 7.5 g/dL (ref 6.5–8.1)

## 2022-03-18 ENCOUNTER — Encounter (HOSPITAL_COMMUNITY): Payer: Self-pay | Admitting: General Surgery

## 2022-03-18 NOTE — Anesthesia Preprocedure Evaluation (Signed)
Anesthesia Evaluation  Patient identified by MRN, date of birth, ID band Patient awake    Reviewed: Allergy & Precautions, NPO status , Patient's Chart, lab work & pertinent test results, reviewed documented beta blocker date and time   Airway Mallampati: II  TM Distance: >3 FB Neck ROM: Full    Dental no notable dental hx. (+) Teeth Intact, Dental Advisory Given   Pulmonary neg pulmonary ROS   Pulmonary exam normal breath sounds clear to auscultation       Cardiovascular hypertension, Pt. on medications Normal cardiovascular exam Rhythm:Regular Rate:Normal     Neuro/Psych  Headaches PSYCHIATRIC DISORDERS Anxiety     ADHD   GI/Hepatic Neg liver ROS,,,IBS   Endo/Other    Morbid obesity  Renal/GU Renal diseaseHx/o renal calculi  negative genitourinary   Musculoskeletal negative musculoskeletal ROS (+)    Abdominal  (+) + obese  Peds  Hematology  (+) Blood dyscrasia, anemia   Anesthesia Other Findings   Reproductive/Obstetrics negative OB ROS                              Anesthesia Physical Anesthesia Plan  ASA: 3  Anesthesia Plan: General   Post-op Pain Management: Tylenol PO (pre-op)*, Precedex and Dilaudid IV   Induction: Intravenous  PONV Risk Score and Plan: 4 or greater and Treatment may vary due to age or medical condition, Scopolamine patch - Pre-op, Midazolam, Dexamethasone and Ondansetron  Airway Management Planned: Oral ETT  Additional Equipment: None  Intra-op Plan:   Post-operative Plan: Extubation in OR  Informed Consent: I have reviewed the patients History and Physical, chart, labs and discussed the procedure including the risks, benefits and alternatives for the proposed anesthesia with the patient or authorized representative who has indicated his/her understanding and acceptance.     Dental advisory given  Plan Discussed with: CRNA and  Anesthesiologist  Anesthesia Plan Comments:         Anesthesia Quick Evaluation

## 2022-03-20 ENCOUNTER — Other Ambulatory Visit: Payer: Self-pay

## 2022-03-20 ENCOUNTER — Inpatient Hospital Stay (HOSPITAL_COMMUNITY)
Admission: RE | Admit: 2022-03-20 | Discharge: 2022-03-22 | DRG: 621 | Disposition: A | Discharge status: Home / Self Care | Payer: BC Managed Care – PPO | Attending: General Surgery | Admitting: General Surgery

## 2022-03-20 ENCOUNTER — Inpatient Hospital Stay (HOSPITAL_COMMUNITY): Payer: BC Managed Care – PPO | Admitting: Anesthesiology

## 2022-03-20 ENCOUNTER — Encounter (HOSPITAL_COMMUNITY): Payer: Self-pay | Admitting: General Surgery

## 2022-03-20 ENCOUNTER — Encounter (HOSPITAL_COMMUNITY)
Admission: RE | Disposition: A | Discharge status: Home / Self Care | Payer: Self-pay | Source: Home / Self Care | Attending: General Surgery

## 2022-03-20 DIAGNOSIS — M25561 Pain in right knee: Secondary | ICD-10-CM | POA: Diagnosis present

## 2022-03-20 DIAGNOSIS — G43909 Migraine, unspecified, not intractable, without status migrainosus: Secondary | ICD-10-CM | POA: Diagnosis present

## 2022-03-20 DIAGNOSIS — Z79899 Other long term (current) drug therapy: Secondary | ICD-10-CM | POA: Diagnosis not present

## 2022-03-20 DIAGNOSIS — G8929 Other chronic pain: Secondary | ICD-10-CM | POA: Diagnosis present

## 2022-03-20 DIAGNOSIS — Z9884 Bariatric surgery status: Secondary | ICD-10-CM | POA: Diagnosis not present

## 2022-03-20 DIAGNOSIS — Z6841 Body Mass Index (BMI) 40.0 and over, adult: Secondary | ICD-10-CM

## 2022-03-20 DIAGNOSIS — F902 Attention-deficit hyperactivity disorder, combined type: Secondary | ICD-10-CM | POA: Diagnosis not present

## 2022-03-20 DIAGNOSIS — R110 Nausea: Secondary | ICD-10-CM | POA: Diagnosis not present

## 2022-03-20 DIAGNOSIS — K449 Diaphragmatic hernia without obstruction or gangrene: Secondary | ICD-10-CM | POA: Diagnosis present

## 2022-03-20 DIAGNOSIS — I10 Essential (primary) hypertension: Secondary | ICD-10-CM | POA: Diagnosis not present

## 2022-03-20 DIAGNOSIS — M25562 Pain in left knee: Secondary | ICD-10-CM | POA: Diagnosis present

## 2022-03-20 DIAGNOSIS — Z87442 Personal history of urinary calculi: Secondary | ICD-10-CM

## 2022-03-20 DIAGNOSIS — Z885 Allergy status to narcotic agent status: Secondary | ICD-10-CM

## 2022-03-20 DIAGNOSIS — F419 Anxiety disorder, unspecified: Secondary | ICD-10-CM | POA: Diagnosis not present

## 2022-03-20 HISTORY — PX: LAPAROSCOPIC GASTRIC SLEEVE RESECTION: SHX5895

## 2022-03-20 HISTORY — PX: UPPER GI ENDOSCOPY: SHX6162

## 2022-03-20 LAB — CBC
HCT: 38.6 % (ref 36.0–46.0)
Hemoglobin: 12.7 g/dL (ref 12.0–15.0)
MCH: 27.5 pg (ref 26.0–34.0)
MCHC: 32.9 g/dL (ref 30.0–36.0)
MCV: 83.5 fL (ref 80.0–100.0)
Platelets: 264 10*3/uL (ref 150–400)
RBC: 4.62 MIL/uL (ref 3.87–5.11)
RDW: 15.2 % (ref 11.5–15.5)
WBC: 16.7 10*3/uL — ABNORMAL HIGH (ref 4.0–10.5)
nRBC: 0 % (ref 0.0–0.2)

## 2022-03-20 LAB — TYPE AND SCREEN
ABO/RH(D): O POS
Antibody Screen: NEGATIVE

## 2022-03-20 LAB — POCT PREGNANCY, URINE: Preg Test, Ur: NEGATIVE

## 2022-03-20 LAB — CREATININE, SERUM
Creatinine, Ser: 0.69 mg/dL (ref 0.44–1.00)
GFR, Estimated: >60 mL/min (ref >60)

## 2022-03-20 LAB — HEMOGLOBIN AND HEMATOCRIT, BLOOD
HCT: 38.4 % (ref 36.0–46.0)
Hemoglobin: 12.7 g/dL (ref 12.0–15.0)

## 2022-03-20 SURGERY — GASTRECTOMY, SLEEVE, LAPAROSCOPIC
Anesthesia: General

## 2022-03-20 MED ORDER — ACETAMINOPHEN 160 MG/5ML PO SOLN: 1000.0000 mg | Freq: Three times a day (TID) | ORAL | Status: DC

## 2022-03-20 MED ORDER — APREPITANT 40 MG PO CAPS
40.0000 mg | ORAL_CAPSULE | ORAL | Status: AC
  Administered 2022-03-20: 40 mg via ORAL
  Filled 2022-03-20: qty 1

## 2022-03-20 MED ORDER — FENTANYL CITRATE (PF) 100 MCG/2ML IJ SOLN
INTRAMUSCULAR | Status: DC | PRN
  Administered 2022-03-20 (×2): 50 ug via INTRAVENOUS
  Administered 2022-03-20: 100 ug via INTRAVENOUS

## 2022-03-20 MED ORDER — ONDANSETRON HCL 4 MG/2ML IJ SOLN
INTRAMUSCULAR | Status: AC
  Filled 2022-03-20: qty 2

## 2022-03-20 MED ORDER — ACETAMINOPHEN 500 MG PO TABS
1000.0000 mg | ORAL_TABLET | ORAL | Status: AC
  Administered 2022-03-20: 1000 mg via ORAL
  Filled 2022-03-20: qty 2

## 2022-03-20 MED ORDER — KCL IN DEXTROSE-NACL 20-5-0.45 MEQ/L-%-% IV SOLN
INTRAVENOUS | Status: DC
  Administered 2022-03-21: 1000 mL via INTRAVENOUS
  Filled 2022-03-20 (×6): qty 1000

## 2022-03-20 MED ORDER — FENTANYL CITRATE PF 50 MCG/ML IJ SOSY
25.0000 ug | PREFILLED_SYRINGE | INTRAMUSCULAR | Status: DC | PRN
  Filled 2022-03-20: qty 1

## 2022-03-20 MED ORDER — ROCURONIUM BROMIDE 10 MG/ML (PF) SYRINGE
PREFILLED_SYRINGE | INTRAVENOUS | Status: DC | PRN
  Administered 2022-03-20: 70 mg via INTRAVENOUS

## 2022-03-20 MED ORDER — MIDAZOLAM HCL 5 MG/5ML IJ SOLN
INTRAMUSCULAR | Status: DC | PRN
  Administered 2022-03-20: 2 mg via INTRAVENOUS

## 2022-03-20 MED ORDER — HYDROMORPHONE HCL 1 MG/ML IJ SOLN
0.2500 mg | INTRAMUSCULAR | Status: DC | PRN
  Administered 2022-03-20: 0.5 mg via INTRAVENOUS

## 2022-03-20 MED ORDER — STERILE WATER FOR IRRIGATION IR SOLN
Status: DC | PRN
  Administered 2022-03-20: 1000 mL

## 2022-03-20 MED ORDER — LIDOCAINE 2% (20 MG/ML) 5 ML SYRINGE
INTRAMUSCULAR | Status: DC | PRN
  Administered 2022-03-20: 100 mg via INTRAVENOUS

## 2022-03-20 MED ORDER — FENTANYL CITRATE (PF) 100 MCG/2ML IJ SOLN
INTRAMUSCULAR | Status: AC
  Filled 2022-03-20: qty 2

## 2022-03-20 MED ORDER — ACETAMINOPHEN 500 MG PO TABS
1000.0000 mg | ORAL_TABLET | Freq: Three times a day (TID) | ORAL | Status: DC
  Administered 2022-03-20 – 2022-03-22 (×2): 1000 mg via ORAL
  Filled 2022-03-20 (×4): qty 2

## 2022-03-20 MED ORDER — HEPARIN SODIUM (PORCINE) 5000 UNIT/ML IJ SOLN
5000.0000 [IU] | Freq: Three times a day (TID) | INTRAMUSCULAR | Status: DC
  Administered 2022-03-20 – 2022-03-22 (×6): 5000 [IU] via SUBCUTANEOUS
  Filled 2022-03-20 (×6): qty 1

## 2022-03-20 MED ORDER — PROCHLORPERAZINE EDISYLATE 10 MG/2ML IJ SOLN
10.0000 mg | Freq: Four times a day (QID) | INTRAMUSCULAR | Status: DC | PRN
  Administered 2022-03-20 – 2022-03-21 (×3): 10 mg via INTRAVENOUS
  Filled 2022-03-20 (×3): qty 2

## 2022-03-20 MED ORDER — OXYCODONE HCL 5 MG/5ML PO SOLN
5.0000 mg | Freq: Four times a day (QID) | ORAL | Status: DC | PRN
  Administered 2022-03-20 (×2): 5 mg via ORAL
  Filled 2022-03-20 (×2): qty 5

## 2022-03-20 MED ORDER — KETAMINE HCL 10 MG/ML IJ SOLN
INTRAMUSCULAR | Status: DC | PRN
  Administered 2022-03-20: 30 mg via INTRAVENOUS

## 2022-03-20 MED ORDER — BUPIVACAINE-EPINEPHRINE 0.25% -1:200000 IJ SOLN
INTRAMUSCULAR | Status: DC | PRN
  Administered 2022-03-20: 30 mL

## 2022-03-20 MED ORDER — LACTATED RINGERS IR SOLN
Status: DC | PRN
  Administered 2022-03-20: 1000 mL

## 2022-03-20 MED ORDER — ONDANSETRON HCL 4 MG/2ML IJ SOLN
INTRAMUSCULAR | Status: DC | PRN
  Administered 2022-03-20: 4 mg via INTRAVENOUS

## 2022-03-20 MED ORDER — AMISULPRIDE (ANTIEMETIC) 5 MG/2ML IV SOLN
INTRAVENOUS | Status: AC
  Filled 2022-03-20: qty 4

## 2022-03-20 MED ORDER — ENSURE MAX PROTEIN PO LIQD
2.0000 [oz_av] | ORAL | Status: DC
  Administered 2022-03-22 (×4): 2 [oz_av] via ORAL

## 2022-03-20 MED ORDER — HYDRALAZINE HCL 20 MG/ML IJ SOLN: 10.0000 mg | INTRAMUSCULAR | Status: DC | PRN

## 2022-03-20 MED ORDER — BUPIVACAINE LIPOSOME 1.3 % IJ SUSP: 20.0000 mL | Freq: Once | INTRAMUSCULAR | Status: DC

## 2022-03-20 MED ORDER — SCOPOLAMINE 1 MG/3DAYS TD PT72: 1.0000 | MEDICATED_PATCH | TRANSDERMAL | Status: DC

## 2022-03-20 MED ORDER — AMISULPRIDE (ANTIEMETIC) 5 MG/2ML IV SOLN
10.0000 mg | Freq: Once | INTRAVENOUS | Status: AC | PRN
  Administered 2022-03-20: 10 mg via INTRAVENOUS

## 2022-03-20 MED ORDER — 0.9 % SODIUM CHLORIDE (POUR BTL) OPTIME
TOPICAL | Status: DC | PRN
  Administered 2022-03-20: 1000 mL

## 2022-03-20 MED ORDER — DEXAMETHASONE SODIUM PHOSPHATE 4 MG/ML IJ SOLN
4.0000 mg | INTRAMUSCULAR | Status: AC
  Administered 2022-03-20: 4 mg via INTRAVENOUS

## 2022-03-20 MED ORDER — HEPARIN SODIUM (PORCINE) 5000 UNIT/ML IJ SOLN
5000.0000 [IU] | INTRAMUSCULAR | Status: AC
  Administered 2022-03-20: 5000 [IU] via SUBCUTANEOUS
  Filled 2022-03-20: qty 1

## 2022-03-20 MED ORDER — PROPOFOL 10 MG/ML IV BOLUS
INTRAVENOUS | Status: AC
  Filled 2022-03-20: qty 20

## 2022-03-20 MED ORDER — CHLORHEXIDINE GLUCONATE 4 % EX LIQD: Freq: Once | CUTANEOUS | Status: DC

## 2022-03-20 MED ORDER — MIDAZOLAM HCL 2 MG/2ML IJ SOLN
INTRAMUSCULAR | Status: AC
  Filled 2022-03-20: qty 2

## 2022-03-20 MED ORDER — ONDANSETRON HCL 4 MG/2ML IJ SOLN
4.0000 mg | Freq: Four times a day (QID) | INTRAMUSCULAR | Status: DC | PRN
  Administered 2022-03-21 – 2022-03-22 (×2): 4 mg via INTRAVENOUS
  Filled 2022-03-20 (×3): qty 2

## 2022-03-20 MED ORDER — LIDOCAINE HCL (PF) 2 % IJ SOLN
INTRAMUSCULAR | Status: AC
  Filled 2022-03-20: qty 5

## 2022-03-20 MED ORDER — SIMETHICONE 80 MG PO CHEW: 80.0000 mg | CHEWABLE_TABLET | Freq: Four times a day (QID) | ORAL | Status: DC | PRN

## 2022-03-20 MED ORDER — ONDANSETRON HCL 4 MG/2ML IJ SOLN
INTRAMUSCULAR | Status: AC
  Administered 2022-03-20: 4 mg
  Filled 2022-03-20: qty 2

## 2022-03-20 MED ORDER — SCOPOLAMINE 1 MG/3DAYS TD PT72
1.0000 | MEDICATED_PATCH | TRANSDERMAL | Status: DC
  Administered 2022-03-20: 1.5 mg via TRANSDERMAL
  Filled 2022-03-20: qty 1

## 2022-03-20 MED ORDER — BUPIVACAINE-EPINEPHRINE (PF) 0.25% -1:200000 IJ SOLN
INTRAMUSCULAR | Status: AC
  Filled 2022-03-20: qty 30

## 2022-03-20 MED ORDER — ONDANSETRON HCL 4 MG/2ML IJ SOLN
4.0000 mg | Freq: Once | INTRAMUSCULAR | Status: AC | PRN
  Administered 2022-03-20: 4 mg via INTRAVENOUS

## 2022-03-20 MED ORDER — CHLORHEXIDINE GLUCONATE 0.12 % MT SOLN
15.0000 mL | Freq: Once | OROMUCOSAL | Status: AC
  Administered 2022-03-20: 15 mL via OROMUCOSAL

## 2022-03-20 MED ORDER — BUPIVACAINE LIPOSOME 1.3 % IJ SUSP
INTRAMUSCULAR | Status: DC | PRN
  Administered 2022-03-20: 20 mL

## 2022-03-20 MED ORDER — BUPIVACAINE LIPOSOME 1.3 % IJ SUSP
INTRAMUSCULAR | Status: AC
  Filled 2022-03-20: qty 20

## 2022-03-20 MED ORDER — LACTATED RINGERS IV SOLN: INTRAVENOUS | Status: DC

## 2022-03-20 MED ORDER — ROCURONIUM BROMIDE 10 MG/ML (PF) SYRINGE
PREFILLED_SYRINGE | INTRAVENOUS | Status: AC
  Filled 2022-03-20: qty 10

## 2022-03-20 MED ORDER — DEXAMETHASONE SODIUM PHOSPHATE 10 MG/ML IJ SOLN
INTRAMUSCULAR | Status: AC
  Filled 2022-03-20: qty 1

## 2022-03-20 MED ORDER — ORAL CARE MOUTH RINSE: 15.0000 mL | Freq: Once | OROMUCOSAL | Status: AC

## 2022-03-20 MED ORDER — LIDOCAINE HCL (PF) 2 % IJ SOLN
INTRAMUSCULAR | Status: DC | PRN
  Administered 2022-03-20: 1.5 mg/kg/h via INTRADERMAL

## 2022-03-20 MED ORDER — KETAMINE HCL 50 MG/5ML IJ SOSY
PREFILLED_SYRINGE | INTRAMUSCULAR | Status: AC
  Filled 2022-03-20: qty 5

## 2022-03-20 MED ORDER — LIDOCAINE HCL 2 % IJ SOLN
INTRAMUSCULAR | Status: AC
  Filled 2022-03-20: qty 20

## 2022-03-20 MED ORDER — HYDROMORPHONE HCL 1 MG/ML IJ SOLN
INTRAMUSCULAR | Status: AC
  Administered 2022-03-20: 0.5 mg via INTRAVENOUS
  Filled 2022-03-20: qty 1

## 2022-03-20 MED ORDER — SUGAMMADEX SODIUM 200 MG/2ML IV SOLN
INTRAVENOUS | Status: DC | PRN
  Administered 2022-03-20: 200 mg via INTRAVENOUS

## 2022-03-20 MED ORDER — PANTOPRAZOLE SODIUM 40 MG IV SOLR
40.0000 mg | Freq: Every day | INTRAVENOUS | Status: DC
  Administered 2022-03-20 – 2022-03-21 (×2): 40 mg via INTRAVENOUS
  Filled 2022-03-20 (×2): qty 10

## 2022-03-20 MED ORDER — SODIUM CHLORIDE 0.9 % IV SOLN
2.0000 g | INTRAVENOUS | Status: AC
  Administered 2022-03-20: 2 g via INTRAVENOUS
  Filled 2022-03-20: qty 2

## 2022-03-20 MED ORDER — PROPOFOL 10 MG/ML IV BOLUS
INTRAVENOUS | Status: DC | PRN
  Administered 2022-03-20: 180 mg via INTRAVENOUS

## 2022-03-20 SURGICAL SUPPLY — 85 items
APL PRP STRL LF DISP 70% ISPRP (MISCELLANEOUS) ×2
APL SRG 32X5 SNPLK LF DISP (MISCELLANEOUS)
APL SWBSTK 6 STRL LF DISP (MISCELLANEOUS)
APPLICATOR COTTON TIP 6 STRL (MISCELLANEOUS) IMPLANT
APPLICATOR COTTON TIP 6IN STRL (MISCELLANEOUS)
APPLIER CLIP ROT 10 11.4 M/L (STAPLE)
APPLIER CLIP ROT 13.4 12 LRG (CLIP)
APR CLP LRG 13.4X12 ROT 20 MLT (CLIP)
APR CLP MED LRG 11.4X10 (STAPLE)
BAG COUNTER SPONGE SURGICOUNT (BAG) IMPLANT
BAG SPNG CNTER NS LX DISP (BAG)
BLADE SURG SZ11 CARB STEEL (BLADE) ×1 IMPLANT
CABLE HIGH FREQUENCY MONO STRZ (ELECTRODE) IMPLANT
CHLORAPREP W/TINT 26 (MISCELLANEOUS) ×2 IMPLANT
CLIP APPLIE ROT 10 11.4 M/L (STAPLE) IMPLANT
CLIP APPLIE ROT 13.4 12 LRG (CLIP) IMPLANT
COVER SURGICAL LIGHT HANDLE (MISCELLANEOUS) ×1 IMPLANT
DEVICE SUT QUICK LOAD TK 5 (SUTURE) IMPLANT
DEVICE SUT TI-KNOT TK 5X26 (SUTURE) IMPLANT
DEVICE SUTURE ENDOST 10MM (ENDOMECHANICALS) IMPLANT
DISSECTOR BLUNT TIP ENDO 5MM (MISCELLANEOUS) IMPLANT
DRAPE UTILITY XL STRL (DRAPES) ×2 IMPLANT
DRSG TEGADERM 2-3/8X2-3/4 SM (GAUZE/BANDAGES/DRESSINGS) ×6 IMPLANT
ELECT L-HOOK LAP 45CM DISP (ELECTROSURGICAL)
ELECT REM PT RETURN 15FT ADLT (MISCELLANEOUS) ×2 IMPLANT
ELECTRODE L-HOOK LAP 45CM DISP (ELECTROSURGICAL) IMPLANT
GAUZE SPONGE 2X2 8PLY STRL LF (GAUZE/BANDAGES/DRESSINGS) IMPLANT
GAUZE SPONGE 4X4 12PLY STRL (GAUZE/BANDAGES/DRESSINGS) IMPLANT
GLOVE BIO SURGEON STRL SZ7.5 (GLOVE) ×1 IMPLANT
GLOVE INDICATOR 8.0 STRL GRN (GLOVE) ×1 IMPLANT
GOWN STRL REUS W/ TWL XL LVL3 (GOWN DISPOSABLE) ×3 IMPLANT
GOWN STRL REUS W/TWL XL LVL3 (GOWN DISPOSABLE) ×3
GRASPER SUT TROCAR 14GX15 (MISCELLANEOUS) ×1 IMPLANT
IRRIG SUCT STRYKERFLOW 2 WTIP (MISCELLANEOUS) ×1
IRRIGATION SUCT STRKRFLW 2 WTP (MISCELLANEOUS) ×1 IMPLANT
KIT BASIN OR (CUSTOM PROCEDURE TRAY) ×1 IMPLANT
KIT TURNOVER KIT A (KITS) IMPLANT
MARKER SKIN DUAL TIP RULER LAB (MISCELLANEOUS) ×1 IMPLANT
MAT PREVALON FULL STRYKER (MISCELLANEOUS) ×1 IMPLANT
NDL SPNL 22GX3.5 QUINCKE BK (NEEDLE) ×1 IMPLANT
NEEDLE SPNL 22GX3.5 QUINCKE BK (NEEDLE) ×1 IMPLANT
PACK UNIVERSAL I (CUSTOM PROCEDURE TRAY) ×1 IMPLANT
PENCIL SMOKE EVACUATOR (MISCELLANEOUS) IMPLANT
RELOAD STAPLE 60 2.6 WHT THN (STAPLE) IMPLANT
RELOAD STAPLE 60 3.6 BLU REG (STAPLE) ×2 IMPLANT
RELOAD STAPLE 60 3.8 GOLD REG (STAPLE) IMPLANT
RELOAD STAPLE 60 4.1 GRN THCK (STAPLE) ×1 IMPLANT
RELOAD STAPLE 60 BLK VRY/THCK (STAPLE) IMPLANT
RELOAD STAPLER 60MM BLK (STAPLE) IMPLANT
RELOAD STAPLER BLUE 60MM (STAPLE) ×3 IMPLANT
RELOAD STAPLER GOLD 60MM (STAPLE) ×1 IMPLANT
RELOAD STAPLER GREEN 60MM (STAPLE) ×1 IMPLANT
RELOAD STAPLER WHITE 60MM (STAPLE) ×1 IMPLANT
SCISSORS LAP 5X45 EPIX DISP (ENDOMECHANICALS) IMPLANT
SEALANT SURGICAL APPL DUAL CAN (MISCELLANEOUS) IMPLANT
SET TUBE SMOKE EVAC HIGH FLOW (TUBING) ×2 IMPLANT
SHEARS HARMONIC ACE PLUS 45CM (MISCELLANEOUS) ×1 IMPLANT
SLEEVE ADV FIXATION 5X100MM (TROCAR) ×2 IMPLANT
SLEEVE GASTRECTOMY 40FR VISIGI (MISCELLANEOUS) ×2 IMPLANT
SOL ANTI FOG 6CC (MISCELLANEOUS) ×2 IMPLANT
SOLUTION ANTI FOG 6CC (MISCELLANEOUS) ×1
SPIKE FLUID TRANSFER (MISCELLANEOUS) ×1 IMPLANT
STAPLE LINE REINFORCEMENT LAP (STAPLE) IMPLANT
STAPLER ECHELON BIOABSB 60 FLE (MISCELLANEOUS) IMPLANT
STAPLER ECHELON LONG 60 440 (INSTRUMENTS) ×1 IMPLANT
STAPLER RELOAD 60MM BLK (STAPLE)
STAPLER RELOAD BLUE 60MM (STAPLE) ×3
STAPLER RELOAD GOLD 60MM (STAPLE) ×1
STAPLER RELOAD GREEN 60MM (STAPLE) ×1
STAPLER RELOAD WHITE 60MM (STAPLE) ×1
STRIP CLOSURE SKIN 1/2X4 (GAUZE/BANDAGES/DRESSINGS) ×1 IMPLANT
SUT MNCRL AB 4-0 PS2 18 (SUTURE) ×1 IMPLANT
SUT SURGIDAC NAB ES-9 0 48 120 (SUTURE) IMPLANT
SUT VICRYL 0 TIES 12 18 (SUTURE) ×1 IMPLANT
SYR 20ML LL LF (SYRINGE) ×1 IMPLANT
SYR 50ML LL SCALE MARK (SYRINGE) ×2 IMPLANT
SYS KII OPTICAL ACCESS 15MM (TROCAR) ×1
SYSTEM KII OPTICAL ACCESS 15MM (TROCAR) ×1 IMPLANT
TOWEL OR 17X26 10 PK STRL BLUE (TOWEL DISPOSABLE) ×1 IMPLANT
TOWEL OR NON WOVEN STRL DISP B (DISPOSABLE) ×1 IMPLANT
TROCAR ADV FIXATION 5X100MM (TROCAR) ×1 IMPLANT
TROCAR XCEL NON-BLD 5MMX100MML (ENDOMECHANICALS) ×1 IMPLANT
TROCAR Z-THREAD OPTICAL 5X100M (TROCAR) IMPLANT
TUBING CONNECTING 10 (TUBING) ×2 IMPLANT
TUBING ENDO SMARTCAP (MISCELLANEOUS) ×1 IMPLANT

## 2022-03-20 NOTE — Transfer of Care (Signed)
Immediate Anesthesia Transfer of Care Note  Patient: Teresa Curry  Procedure(s) Performed: LAPAROSCOPIC SLEEVE GASTRECTOMY WITH POSSIBLE HIATAL HERNIA REPAIR UPPER GI ENDOSCOPY  Patient Location: PACU  Anesthesia Type:General  Level of Consciousness: sedated  Airway & Oxygen Therapy: Patient Spontanous Breathing and Patient connected to face mask oxygen  Post-op Assessment: Report given to RN and Post -op Vital signs reviewed and stable  Post vital signs: Reviewed and stable  Last Vitals:  Vitals Value Taken Time  BP    Temp    Pulse 98 03/20/22 0906  Resp    SpO2 100 % 03/20/22 0906  Vitals shown include unvalidated device data.  Last Pain:  Vitals:   03/20/22 0548  TempSrc: Oral  PainSc:          Complications: No notable events documented.

## 2022-03-20 NOTE — H&P (Signed)
PROVIDER: Nicklaus Alviar Leanne Chang, MD  MRN: [id number] DOB: July 14, 1985 DATE OF ENCOUNTER: 03/09/2022 Subjective  Chief Complaint: Bariatric Pre-op   History of Present Illness: Teresa Curry is a 36 y.o. female who is seen today for long-term follow-up regarding her severe obesity,and chronic bilateral knee pain.  She states that she really has not had any heartburn lately. She only would have it if she had pizza and has not eaten pizza in the few months. She did have 1 episode of a migraine prompting her trip to urgent care. Otherwise she denies any medical changes.  She denies any chest pain, chest pressure, shortness of breath, dyspnea on exertion, abdominal pain, diarrhea or constipation. She denies any dysphagia.  Review of Systems: A complete review of systems was obtained from the patient. I have reviewed this information and discussed as appropriate with the patient. See HPI as well for other ROS.  ROS  Medical History: Past Medical History: Diagnosis Date Anxiety  Patient Active Problem List Diagnosis Attention deficit hyperactivity disorder (ADHD), combined type History of hypertension  History reviewed. No pertinent surgical history.  Allergies Allergen Reactions Morphine Itching  Current Outpatient Medications on File Prior to Visit Medication Sig Dispense Refill dextroamphetamine-amphetamine (ADDERALL) 10 mg tablet Take 1 tablet every day by oral route.  No current facility-administered medications on file prior to visit.  History reviewed. No pertinent family history.  Social History  Tobacco Use Smoking Status Never Smokeless Tobacco Never   Social History  Socioeconomic History Marital status: Married Tobacco Use Smoking status: Never Smokeless tobacco: Never Substance and Sexual Activity Alcohol use: Yes Comment: less than a bottle of wine a week Drug use: Never  Objective:  Vitals: 03/09/22 1007 BP: 134/84 Pulse: 110 Temp: 36.7 C  (98.1 F) SpO2: 98% Weight: (!) 115.8 kg (255 lb 6.4 oz) Height: 160 cm (5\' 3" )  Body mass index is 45.24 kg/m.  Gen: alert, NAD, non-toxic appearing; severe obesity Pupils: equal, no scleral icterus Pulm: Lungs clear to auscultation, symmetric chest rise CV: regular rate and rhythm Abd: soft, nontender, nondistended. No cellulitis. No incisional hernia Ext: no edema, Skin: no rash, no jaundice  Labs, Imaging and Diagnostic Testing:  CBC January 03, 2022 showed a white blood cell count of 10.8, hemoglobin 12.4, hematocrit 37.7, MCV 79, platelet count 470; basic metabolic panel normal except for blood glucose 103  Labs from December 16, 2021 Normal comprehensive metabolic panel, J62, CBC, TSH, hemoglobin A1c, lipid panel, folate; vitamin D low at 22.1  Upper GI December 09, 2021 INDINGS: [place] Radiograph: Within normal limits.  Esophagus: Distal esophageal mucosal ring (Schatzki ring), estimated about 16 mm in diameter.  Esophageal motility: Unremarkable  Gastroesophageal reflux: Not observed. On series 18, a small amount of contrast along the hiatus is in within the small hiatal hernia.  Ingested 54mm barium tablet: Transient delay in distal esophagus, passage within 1 minute.  Stomach: Small sliding hiatal hernia, best seen on series number 9.  Gastric emptying: Normal.  Duodenum: Normal appearance.  Other: None.  IMPRESSION: 1. Small type 1 hiatal hernia. 2. Benign distal esophageal mucosal ring (Schatzki ring), estimated about 16 mm in luminal diameter. Schatzki rings of this size can sometimes contribute to symptoms.  Chest x-ray December 09, 2021 negative  CT head January 03, 2022 negative  ED encounter January 03, 2022  PCP note February 08 2022  Assessment and Plan: Diagnoses and all orders for this visit:  Severe obesity (CMS-HCC)  Attention deficit hyperactivity disorder (ADHD), combined type  Pain in both knees, unspecified chronicity  Sliding hiatal  hernia   We reviewed her labs and imaging and work-up to date. We discussed the findings of a small sliding hiatal hernia. We discussed that we would test for 1 intraoperatively. I discussed the anatomy of the foregut. We discussed if found to have a clinically significant sliding hiatal hernia I would recommend repair during her sleeve gastrectomy. I discussed the steps of that procedure. We rediscussed the typical hospitalization and the typical recovery. We rediscussed the typical issues that we see in the first few weeks after surgery. She reviewed the surgical consent form and signed it and I answered all of her questions that she had. We discussed the importance of the preoperative meal plan.  This patient encounter took 30 minutes today to perform the following: take history, perform exam, review outside records, interpret imaging, counsel the patient on their diagnosis and document encounter, findings & plan in the EHR  No follow-ups on file.  Leighton Ruff. Redmond Pulling MD FACS General, Minimally Invasive, & Bariatric Surgery Electronically signed by Rudean Curt, MD at 03/09/2022 1:24 PM EDT

## 2022-03-20 NOTE — Interval H&P Note (Signed)
History and Physical Interval Note:  03/20/2022 7:20 AM  Teresa Curry  has presented today for surgery, with the diagnosis of morbid obesity.  The various methods of treatment have been discussed with the patient and family. After consideration of risks, benefits and other options for treatment, the patient has consented to  Procedure(s): [place] (N/A) UPPER GI ENDOSCOPY (N/A) as a surgical intervention.  The patient's history has been reviewed, patient examined, no change in status, stable for surgery.  I have reviewed the patient's chart and labs.  Questions were answered to the patient's satisfaction.     [name]

## 2022-03-20 NOTE — Op Note (Signed)
Preoperative diagnosis: laparoscopic sleeve gastrectomy  Postoperative diagnosis: Same   Procedure: Upper endoscopy   Surgeon: Adam Demary A Texanna Hilburn, M.D.  Anesthesia: Gen.   Description of procedure: The endoscope was placed in the mouth and oropharynx and under endoscopic vision it was advanced to the esophagogastric junction which was identified at 36cm from the teeth.  The pouch was tensely insufflated while the upper abdomen was flooded with irrigation to perform a leak test, which was negative. No bubbles were seen.  The staple line was hemostatic and the lumen was evenly tubular without undue narrowing, angulation or twisting specifically at the incisura angularis. The lumen was decompressed and the scope was withdrawn without difficulty.    Dixie Jafri A Lenola Lockner, M.D. General, Bariatric, & Minimally Invasive Surgery [facility]    

## 2022-03-20 NOTE — Anesthesia Procedure Notes (Signed)
Procedure Name: Intubation Date/Time: 03/20/2022 7:32 AM  Performed by: Lind Covert, CRNAPre-anesthesia Checklist: Patient identified, Emergency Drugs available, Suction available, Patient being monitored and Timeout performed Patient Re-evaluated:Patient Re-evaluated prior to induction Oxygen Delivery Method: Circle system utilized Preoxygenation: Pre-oxygenation with 100% oxygen Induction Type: IV induction Ventilation: Mask ventilation without difficulty Laryngoscope Size: Mac and 3 Tube type: Oral Tube size: 7.0 mm Number of attempts: 1 Airway Equipment and Method: Stylet Placement Confirmation: ETT inserted through vocal cords under direct vision, positive ETCO2 and breath sounds checked- equal and bilateral Secured at: 21 cm Tube secured with: Tape Dental Injury: Teeth and Oropharynx as per pre-operative assessment

## 2022-03-20 NOTE — Anesthesia Postprocedure Evaluation (Signed)
Anesthesia Post Note  Patient: Teresa Curry  Procedure(s) Performed: LAPAROSCOPIC SLEEVE GASTRECTOMY WITH POSSIBLE HIATAL HERNIA REPAIR UPPER GI ENDOSCOPY     Patient location during evaluation: PACU Anesthesia Type: General Level of consciousness: awake and alert and oriented Pain management: pain level controlled Vital Signs Assessment: post-procedure vital signs reviewed and stable Respiratory status: spontaneous breathing, nonlabored ventilation and respiratory function stable Cardiovascular status: blood pressure returned to baseline and stable Postop Assessment: no apparent nausea or vomiting Anesthetic complications: no   No notable events documented.  Last Vitals:  Vitals:   03/20/22 0945 03/20/22 1000  BP: (!) 158/86 (!) 161/87  Pulse: 77 90  Resp: 18 20  Temp:    SpO2: 100% 98%    Last Pain:  Vitals:   03/20/22 1000  TempSrc:   PainSc: Asleep                 Abeeha Twist A.

## 2022-03-20 NOTE — Progress Notes (Signed)
PHARMACY CONSULT FOR:  Risk Assessment for Post-Discharge VTE Following Bariatric Surgery  Post-Discharge VTE Risk Assessment: This patient's probability of 30-day post-discharge VTE is increased due to the factors marked: x Sleeve gastrectomy   Liver disorder (transplant, cirrhosis, or nonalcoholic steatohepatitis)   Hx of VTE   Hemorrhage requiring transfusion   GI perforation, leak, or obstruction   ====================================================    Female    Age >/=60 years    BMI >/=50 kg/m2    CHF    Dyspnea at Rest    Paraplegia   x Non-gastric-band surgery    Operation Time >/=3 hr    Return to OR     Length of Stay >/= 3 d   Hypercoagulable condition   Significant venous stasis      Predicted probability of 30-day post-discharge VTE: 0.16%  Other patient-specific factors to consider:   Recommendation for Discharge: No pharmacologic prophylaxis post-discharge   Teresa Curry is a 36 y.o. female who underwent  [place]  on 03/20/2022   Case start: 0753 Case end: 0856   Allergies  Allergen Reactions   Morphine And Related Itching and Nausea Only    Patient Measurements: Height: 5\' 3"  (160 cm) Weight: 114.4 kg (252 lb 3.2 oz) IBW/kg (Calculated) : 52.4 Body mass index is 44.68 kg/m.  No results for input(s): "WBC", "HGB", "HCT", "PLT", "APTT", "CREATININE", "LABCREA", "CREAT24HRUR", "MG", "PHOS", "ALBUMIN", "PROT", "AST", "ALT", "ALKPHOS", "BILITOT", "BILIDIR", "IBILI" in the last 72 hours. Estimated Creatinine Clearance: 118.5 mL/min (by C-G formula based on SCr of 0.57 mg/dL).    Past Medical History:  Diagnosis Date   ADHD    Anemia    during pregnancy   Anxiety    Headache    migraines   IBS (irritable bowel syndrome)    no longer having sx   Kidney stones    Pregnancy induced hypertension      Medications Prior to Admission  Medication Sig Dispense Refill Last Dose    amphetamine-dextroamphetamine (ADDERALL) 20 MG tablet Take 1 tablet (20 mg total) by mouth daily before breakfast. (Patient taking differently: Take 20 mg by mouth 2 (two) times daily as needed (ADHD).) 30 tablet 0    amphetamine-dextroamphetamine (ADDERALL) 20 MG tablet Take 1 tablet (20 mg total) by mouth daily before breakfast. 30 tablet 0 03/17/2022   [START ON 04/09/2022] amphetamine-dextroamphetamine (ADDERALL) 20 MG tablet Take 1 tablet (20 mg total) by mouth daily before breakfast. 30 tablet 0 duplicate       Peggyann Juba, PharmD, BCPS Pharmacy: [phone] 03/20/2022,9:19 AM

## 2022-03-20 NOTE — Op Note (Signed)
03/20/2022 Burnetta A Fregeau 03-24-1986 [id number]   PRE-OPERATIVE DIAGNOSIS:   Severe obesity BMI 45 Chronic bilateral knee pain Sliding hiatal hernia  POST-OPERATIVE DIAGNOSIS:  same  PROCEDURE:  Procedure(s): LAPAROSCOPIC SLEEVE GASTRECTOMY SLIDING WITH HIATAL HERNIA REPAIR UPPER GI ENDOSCOPY  SURGEON:  Surgeon(s): Atilano Ina, MD FACS FASMBS  ASSISTANTS: Phylliss Blakes MD FACS  ANESTHESIA:   general  DRAINS: none   BOUGIE: 40 fr ViSiGi  LOCAL MEDICATIONS USED:   Exparel  EBL: minimal  SPECIMEN:  Source of Specimen:  Greater curvature of stomach  DISPOSITION OF SPECIMEN:  PATHOLOGY  COUNTS:  YES  INDICATION FOR PROCEDURE: This is a very pleasant 36 y.o.-year-old morbidly obese female who has had unsuccessful attempts for sustained weight loss. The patient presents today for a planned laparoscopic sleeve gastrectomy with upper endoscopy. We have discussed the risk and benefits of the procedure extensively preoperatively. Please see my separate notes.  PROCEDURE: After obtaining informed consent and receiving 5000 units of subcutaneous heparin, the patient was brought to the operating room at [facility] and placed supine on the operating room table. General endotracheal anesthesia was established. Sequential compression devices were placed. A orogastric tube was placed. The patient's abdomen was prepped and draped in the usual standard surgical fashion. The patient received preoperative IV antibiotic. A surgical timeout was performed. ERAS protocol used.   Access to the abdomen was achieved using a 5 mm 0 laparoscope thru a 5 mm trocar In the left upper Quadrant 2 fingerbreadths below the left subcostal margin using the Optiview technique. Pneumoperitoneum was smoothly established up to 15 mm of mercury. The laparoscope was advanced and the abdominal cavity was surveilled. The patient was then placed in reverse Trendelenburg.   A 5 mm trocar was placed slightly above  and to the left of the umbilicus under direct visualization.  The [facility] liver retractor was placed under the left lobe of the liver through a 5 mm trocar incision site in the subxiphoid position. A 5 mm trocar was placed in the lateral right upper quadrant along with a 15 mm trocar in the mid right abdomen. A final 5 mm trocar was placed in the lateral LUQ.  All under direct visualization after exparel had been infiltrated in bilateral lateral upper abdominal walls as a TAP block for postoperative pain relief.  The stomach was inspected. It was completely decompressed and the orogastric tube was removed.  There was a small anterior dimple that was obviously visible. Her preop UGI showed a small sliding hiatal hernia.  The gastrohepatic ligament was incised with harmonic scalpel. The right crus was identified. We identified the crossing fat along the right crus. The adipose tissue just above this area was incised with harmonic scalpel. I then bluntly dissected out this area and identified the left crus. There was evidence of a hiatal hernia. I then mobilized the esophagus. The left and right crus were further mobilized with blunt dissection. I was then able to reapproximate the left and right crus with 0 Ethibond using an Endostitch suture device and securing it with a titanium tyknot. The hiatus looked snugged at this point.   We identified the pylorus and measured 6 cm proximal to the pylorus and identified an area of where we would start taking down the short gastric vessels. Harmonic scalpel was used to take down the short gastric vessels along the greater curvature of the stomach. We were able to enter the lesser sac. We continued to march along the greater curvature  of the stomach taking down the short gastrics. As we approached the gastrosplenic ligament we took care in this area not to injure the spleen. We were able to take down the entire gastrosplenic ligament. We then mobilized the fundus away  from the left crus of diaphragm. There were not any significant posterior gastric avascular attachments. This left the stomach completely mobilized. No vessels had been taken down along the lesser curvature of the stomach.  We then reidentified the pylorus. A 40Fr ViSiGi was then placed in the oropharynx and advanced down into the stomach and placed in the distal antrum and positioned along the lesser curvature. It was placed under suction which secured the 40Fr ViSiGi in place along the lesser curve. Then using the Ethicon echelon 60 mm stapler with a gold load with ethicon staple line reinforcement (ESLR), I placed a stapler along the antrum approximately 5 cm from the pylorus. The stapler was angled so that there is ample room at the angularis incisura. I then fired the first staple load after inspecting it posteriorly to ensure adequate space both anteriorly and posteriorly. At this point I still was not completely past the angularis so with a blue load with ESLR, I placed the stapler in position just inside the prior stapleline. We then rotated the stomach to insure that there was adequate anteriorly as well as posteriorly. The stapler was then fired.  At this point I started using 60 mm blue load staple cartridges with ESLR. The echelon stapler was then repositioned with a 60 mm blue load with ESLR and we continued to march up along the ViSiGi. My assistant was holding traction along the greater curvature stomach along the cauterized short gastric vessels ensuring that the stomach was symmetrically retracted. Prior to each firing of the staple, we rotated the stomach to ensure that there is adequate stomach left.  As we approached the fundus, I used 60 mm white cartridge with ESLR aiming  lateral to the GE junction after mobilizing some of the esophageal fat pad.  The sleeve was inspected. There is no evidence of cork screw. The staple line appeared hemostatic. The CRNA inflated the ViSiGi to the green zone  and the upper abdomen was flooded with saline. There were no bubbles. The sleeve was decompressed and the ViSiGi removed.  We decreased intra-abdominal pressure to 8 mmHg.  My assistant scrubbed out and performed an upper endoscopy. The sleeve easily distended with air and the scope was easily advanced to the pylorus. There is no evidence of internal bleeding or cork screwing. There was no narrowing at the angularis. There is no evidence of bubbles. Please see her operative note for further details. The gastric sleeve was decompressed and the endoscope was removed.  The gastric sleeve was inspected.  There was no evidence of bleeding along the staple line.  The greater curvature the stomach was grasped with a laparoscopic grasper and removed from the 15 mm trocar site.  The liver retractor was removed. I then closed the 15 mm trocar site with 1 interrupted 0 Vicryl sutures through the fascia using the endoclose. The closure was viewed laparoscopically and it was airtight. Remaining Exparel was then infiltrated in the preperitoneal spaces around the trocar sites. Pneumoperitoneum was released. All trocar sites were closed with a 4-0 Monocryl in a subcuticular fashion followed by the application of steri-strips, and bandaids. The patient was extubated and taken to the recovery room in stable condition. All needle, instrument, and sponge counts were correct x2.  There are no immediate complications  (1) 60 mm gold with ESLR (3) 60 mm blue with ESLR (1) 60 mm white with ESLR  PLAN OF CARE: Admit to inpatient   PATIENT DISPOSITION:  PACU - hemodynamically stable.   Delay start of Pharmacological VTE agent (>24hrs) due to surgical blood loss or risk of bleeding:  no  Leighton Ruff. Redmond Pulling, MD, FACS FASMBS General, Bariatric, & Minimally Invasive Surgery [facility], [place]

## 2022-03-21 ENCOUNTER — Encounter (HOSPITAL_COMMUNITY): Payer: Self-pay | Admitting: General Surgery

## 2022-03-21 ENCOUNTER — Other Ambulatory Visit: Payer: Self-pay

## 2022-03-21 LAB — COMPREHENSIVE METABOLIC PANEL
ALT: 21 U/L (ref 0–44)
AST: 17 U/L (ref 15–41)
Albumin: 3.7 g/dL (ref 3.5–5.0)
Alkaline Phosphatase: 79 U/L (ref 38–126)
Anion gap: 3 — ABNORMAL LOW (ref 5–15)
BUN: <5 mg/dL — ABNORMAL LOW (ref 6–20)
CO2: 25 mmol/L (ref 22–32)
Calcium: 8.3 mg/dL — ABNORMAL LOW (ref 8.9–10.3)
Chloride: 108 mmol/L (ref 98–111)
Creatinine, Ser: 0.6 mg/dL (ref 0.44–1.00)
GFR, Estimated: >60 mL/min (ref >60)
Glucose, Bld: 134 mg/dL — ABNORMAL HIGH (ref 70–99)
Potassium: 3.9 mmol/L (ref 3.5–5.1)
Sodium: 136 mmol/L (ref 135–145)
Total Bilirubin: 0.4 mg/dL (ref 0.3–1.2)
Total Protein: 7.5 g/dL (ref 6.5–8.1)

## 2022-03-21 LAB — CBC WITH DIFFERENTIAL/PLATELET
Abs Immature Granulocytes: 0.03 10*3/uL (ref 0.00–0.07)
Basophils Absolute: 0 10*3/uL (ref 0.0–0.1)
Basophils Relative: 0 %
Eosinophils Absolute: 0 10*3/uL (ref 0.0–0.5)
Eosinophils Relative: 0 %
HCT: 39.1 % (ref 36.0–46.0)
Hemoglobin: 12.9 g/dL (ref 12.0–15.0)
Immature Granulocytes: 0 %
Lymphocytes Relative: 9 %
Lymphs Abs: 1.1 10*3/uL (ref 0.7–4.0)
MCH: 27.5 pg (ref 26.0–34.0)
MCHC: 33 g/dL (ref 30.0–36.0)
MCV: 83.4 fL (ref 80.0–100.0)
Monocytes Absolute: 1 10*3/uL (ref 0.1–1.0)
Monocytes Relative: 8 %
Neutro Abs: 10.4 10*3/uL — ABNORMAL HIGH (ref 1.7–7.7)
Neutrophils Relative %: 83 %
Platelets: 313 10*3/uL (ref 150–400)
RBC: 4.69 MIL/uL (ref 3.87–5.11)
RDW: 15.2 % (ref 11.5–15.5)
WBC: 12.5 10*3/uL — ABNORMAL HIGH (ref 4.0–10.5)
nRBC: 0 % (ref 0.0–0.2)

## 2022-03-21 LAB — SURGICAL PATHOLOGY

## 2022-03-21 MED ORDER — SUCRALFATE 1 GM/10ML PO SUSP: 1.0000 g | Freq: Four times a day (QID) | ORAL | Status: DC | PRN

## 2022-03-21 MED ORDER — SODIUM CHLORIDE 0.9 % IV SOLN
12.5000 mg | Freq: Four times a day (QID) | INTRAVENOUS | Status: DC | PRN
  Administered 2022-03-21 (×2): 12.5 mg via INTRAVENOUS
  Filled 2022-03-21: qty 0.5
  Filled 2022-03-21 (×2): qty 12.5

## 2022-03-21 NOTE — Progress Notes (Signed)
Patient drank two 60 oz, and said should could not drink anymore.  Stated she didn't think should could tolerate it.  Even with encouragement she didn't take anymore sips. Compazine and zofran given.  She's sleeping and said she'll attempt again in the morning.Marland Kitchen

## 2022-03-21 NOTE — Progress Notes (Signed)
Patient alert and oriented, Post op day 1.  Provided support and encouragement.  Encouraged pulmonary toilet, ambulation and small sips of liquids.  Pt was working on 4th cup of water during rounding.  Pt denies vomiting, but states "just nauseous".  All questions answered.  Will continue to monitor.

## 2022-03-21 NOTE — TOC Progression Note (Signed)
Transition of Care [facility]) - Progression Note    Patient Details  Name: Teresa Curry MRN: [id number] Date of Birth: Jul 28, 1985  Transition of Care [facility]) CM/SW Contact  Servando Snare,  Phone Number: 03/21/2022, 8:50 AM  Clinical Narrative:    Transition of Care (TOC) Screening Note   Patient Details  Name: Teresa Curry Date of Birth: 1985-06-13   Transition of Care [facility]) CM/SW Contact:    Servando Snare, LCSW Phone Number: 03/21/2022, 8:50 AM    Transition of Care Department [facility]) has reviewed patient and no TOC needs have been identified at this time. We will continue to monitor patient advancement through interdisciplinary progression rounds. If new patient transition needs arise, please place a TOC consult.           Expected Discharge Plan and Services                                                 Social Determinants of Health (SDOH) Interventions    Readmission Risk Interventions     No data to display

## 2022-03-21 NOTE — Discharge Instructions (Signed)

## 2022-03-21 NOTE — Progress Notes (Signed)
1 Day Post-Op   Subjective/Chief Complaint: Pt had significant nausea overnight and this am. Nausea refractory to zofran and compazine Only took in 2 small cups of water (medicine cups)   Objective: Vital signs in last 24 hours: Temp:  [97.3 F (36.3 C)-98.9 F (37.2 C)] 98.6 F (37 C) (11/07 0941) Pulse Rate:  [66-84] 74 (11/07 0941) Resp:  [16-19] 18 (11/07 0941) BP: (129-169)/(72-110) 131/77 (11/07 0941) SpO2:  [94 %-98 %] 98 % (11/07 0941) Last BM Date : 03/19/22  Intake/Output from previous day: 11/06 0701 - 11/07 0700 In: 3646.9 [P.O.:180; I.V.:3466.9] Out: 2650 [Urine:2650] Intake/Output this shift: Total I/O In: 200.1 [I.V.:200.1] Out: 925 [Urine:925]  Alert, not ill appearing Non labored Obese, soft, incisions ok. Approp TTP No edema  Lab Results:  Recent Labs    03/20/22 1028 03/20/22 1320 03/21/22 0512  WBC 16.7*  --  12.5*  HGB 12.7 12.7 12.9  HCT 38.6 38.4 39.1  PLT 264  --  313   BMET Recent Labs    03/20/22 1028 03/21/22 0512  NA  --  136  K  --  3.9  CL  --  108  CO2  --  25  GLUCOSE  --  134*  BUN  --  <5*  CREATININE 0.69 0.60  CALCIUM  --  8.3*   PT/INR No results for input(s): "LABPROT", "INR" in the last 72 hours. ABG No results for input(s): "PHART", "HCO3" in the last 72 hours.  Invalid input(s): "PCO2", "PO2"  Studies/Results: No results found.  Anti-infectives: Anti-infectives (From admission, onward)    Start     Dose/Rate Route Frequency Ordered Stop   03/20/22 0600  cefoTEtan (CEFOTAN) 2 g in sodium chloride 0.9 % 100 mL IVPB        2 g 200 mL/hr over 30 Minutes Intravenous On call to O.R. 03/20/22 8502 03/20/22 0733       Assessment/Plan: s/p Procedure(s): LAPAROSCOPIC SLEEVE GASTRECTOMY WITH POSSIBLE HIATAL HERNIA REPAIR (N/A) UPPER GI ENDOSCOPY (N/A)  No fever. No tachy Switch to Phenergan Bari clears/fulls as tolerated Cont subcu heparin, scds Not a candidate for discharge today - oral intake not  safe for discharge - high risk of readmission Will try carafate if phenergan doesn't work. Denies pain with oral intake.  Did sort of dilate a Schatzki ring with visigi  LOS: 1 day    [name] 03/21/2022

## 2022-03-22 LAB — CBC WITH DIFFERENTIAL/PLATELET
Abs Immature Granulocytes: 0.03 10*3/uL (ref 0.00–0.07)
Basophils Absolute: 0 10*3/uL (ref 0.0–0.1)
Basophils Relative: 0 %
Eosinophils Absolute: 0 10*3/uL (ref 0.0–0.5)
Eosinophils Relative: 0 %
HCT: 39.5 % (ref 36.0–46.0)
Hemoglobin: 12.6 g/dL (ref 12.0–15.0)
Immature Granulocytes: 0 %
Lymphocytes Relative: 19 %
Lymphs Abs: 1.5 10*3/uL (ref 0.7–4.0)
MCH: 26.9 pg (ref 26.0–34.0)
MCHC: 31.9 g/dL (ref 30.0–36.0)
MCV: 84.2 fL (ref 80.0–100.0)
Monocytes Absolute: 0.8 10*3/uL (ref 0.1–1.0)
Monocytes Relative: 10 %
Neutro Abs: 5.7 10*3/uL (ref 1.7–7.7)
Neutrophils Relative %: 71 %
Platelets: 296 10*3/uL (ref 150–400)
RBC: 4.69 MIL/uL (ref 3.87–5.11)
RDW: 15.9 % — ABNORMAL HIGH (ref 11.5–15.5)
WBC: 8 10*3/uL (ref 4.0–10.5)
nRBC: 0 % (ref 0.0–0.2)

## 2022-03-22 MED ORDER — ACETAMINOPHEN 500 MG PO TABS: 1000.0000 mg | ORAL_TABLET | Freq: Three times a day (TID) | ORAL | 0 refills | Status: AC

## 2022-03-22 MED ORDER — PANTOPRAZOLE SODIUM 40 MG PO TBEC: 40.0000 mg | DELAYED_RELEASE_TABLET | Freq: Every day | ORAL | 0 refills | Status: DC

## 2022-03-22 MED ORDER — ONDANSETRON 4 MG PO TBDP: 4.0000 mg | ORAL_TABLET | Freq: Four times a day (QID) | ORAL | 0 refills | Status: DC | PRN

## 2022-03-22 MED ORDER — OXYCODONE HCL 5 MG PO TABS: 5.0000 mg | ORAL_TABLET | Freq: Four times a day (QID) | ORAL | 0 refills | Status: DC | PRN

## 2022-03-22 NOTE — Progress Notes (Signed)
  Transition of Care (TOC) Screening Note   Patient Details  Name: Teresa Curry Date of Birth: 1985/06/11   Transition of Care [facility]) CM/SW Contact:    Lavenia Atlas, RN Phone Number: 03/22/2022, 1:07 PM    Transition of Care Department [facility]) has reviewed patient and no TOC needs have been identified at this time. We will continue to monitor patient advancement through interdisciplinary progression rounds. If new patient transition needs arise, please place a TOC consult.

## 2022-03-22 NOTE — Plan of Care (Signed)
  Problem: Education: Goal: Ability to state signs and symptoms to report to health care provider will improve Outcome: Adequate for Discharge Goal: Knowledge of the prescribed self-care regimen will improve Outcome: Adequate for Discharge Goal: Knowledge of discharge needs will improve Outcome: Adequate for Discharge   Problem: Activity: Goal: Ability to tolerate increased activity will improve Outcome: Adequate for Discharge   Problem: Bowel/Gastric: Goal: Gastrointestinal status for postoperative course will improve Outcome: Adequate for Discharge Goal: Occurrences of nausea will decrease Outcome: Adequate for Discharge   Problem: Coping: Goal: Development of coping mechanisms to deal with changes in body function or appearance will improve Outcome: Adequate for Discharge   Problem: Fluid Volume: Goal: Maintenance of adequate hydration will improve Outcome: Adequate for Discharge   Problem: Nutritional: Goal: Nutritional status will improve Outcome: Adequate for Discharge   Problem: Clinical Measurements: Goal: Will show no signs or symptoms of venous thromboembolism Outcome: Adequate for Discharge Goal: Will remain free from infection Outcome: Adequate for Discharge Goal: Will show no signs of GI Leak Outcome: Adequate for Discharge   Problem: Respiratory: Goal: Will regain and/or maintain adequate ventilation Outcome: Adequate for Discharge   Problem: Pain Management: Goal: Pain level will decrease Outcome: Adequate for Discharge   Problem: Skin Integrity: Goal: Demonstration of wound healing without infection will improve Outcome: Adequate for Discharge   Problem: Education: Goal: Knowledge of General Education information will improve Description: Including pain rating scale, medication(s)/side effects and non-pharmacologic comfort measures Outcome: Adequate for Discharge   Problem: Health Behavior/Discharge Planning: Goal: Ability to manage  health-related needs will improve Outcome: Adequate for Discharge   Problem: Clinical Measurements: Goal: Ability to maintain clinical measurements within normal limits will improve Outcome: Adequate for Discharge Goal: Will remain free from infection Outcome: Adequate for Discharge Goal: Diagnostic test results will improve Outcome: Adequate for Discharge Goal: Respiratory complications will improve Outcome: Adequate for Discharge Goal: Cardiovascular complication will be avoided Outcome: Adequate for Discharge   Problem: Activity: Goal: Risk for activity intolerance will decrease Outcome: Adequate for Discharge   Problem: Nutrition: Goal: Adequate nutrition will be maintained Outcome: Adequate for Discharge   Problem: Coping: Goal: Level of anxiety will decrease Outcome: Adequate for Discharge   Problem: Elimination: Goal: Will not experience complications related to bowel motility Outcome: Adequate for Discharge Goal: Will not experience complications related to urinary retention Outcome: Adequate for Discharge   Problem: Pain Managment: Goal: General experience of comfort will improve Outcome: Adequate for Discharge   Problem: Safety: Goal: Ability to remain free from injury will improve Outcome: Adequate for Discharge   Problem: Skin Integrity: Goal: Risk for impaired skin integrity will decrease Outcome: Adequate for Discharge   

## 2022-03-22 NOTE — Progress Notes (Signed)
Patient alert and oriented, pain is controlled. Patient is tolerating fluids, advanced to protein shake today, patient is tolerating well.  Reviewed Gastric sleeve discharge instructions with patient and patient is able to articulate understanding.  Provided information on BELT program, Support Group and [facility] outpatient pharmacy. All questions answered, will continue to monitor.  

## 2022-03-23 NOTE — Discharge Summary (Signed)
Physician Discharge Summary  Teresa Curry JJK:[id number] DOB: 1985-11-16 DOA: 03/20/2022  PCP: Tawnya Crook, MD  Admit date: 03/20/2022 Discharge date: 03/22/2022  Recommendations for Outpatient Follow-up:    Follow-up Information     [name], MD. Go on 04/12/2022.   Specialty: General Surgery Why: at 10:15am. Please arrive 15 minutes prior to your appointment time. Thank you. Contact information: [address] [place] [address] [phone]         [name], [name], [place]. Go on 05/19/2022.   Specialty: General Surgery Why: at 9:45am for Dr. Redmond Pulling. Please arrive 15 minutes prior to your appointment time. Thank you. Contact information: [address] [place] [address] [phone]                Discharge Diagnoses:  Principal Problem:   S/P laparoscopic sleeve gastrectomy Severe obesity BMI 45 Chronic bilateral knee pain Sliding hiatal hernia  Surgical Procedure: Laparoscopic Sleeve Gastrectomy with sliding hiatal hernia repair, upper endoscopy  Discharge Condition: Good Disposition: Home  Diet recommendation: Postoperative sleeve gastrectomy diet (liquids only)  Filed Weights   03/20/22 0548  Weight: 114.4 kg     Hospital Course:  The patient was admitted for a planned laparoscopic sleeve gastrectomy with sliding hiatal hernia repair. Please see operative note. Preoperatively the patient was given 5000 units of subcutaneous heparin for DVT prophylaxis. Postoperative prophylactic heparin dosing was started on the evening of postoperative day 0. ERAS protocol was used. On the evening of postoperative day 0, the patient was started on water and ice chips. However she had severe nausea limiting oral intake. On postoperative day 1 the patient had no fever or tachycardia and was tolerating water in their diet was gradually advanced throughout the day but still had significant nausea impacting oral intake and didn't meet  criteria for discharge. On POD 2, The patient was ambulating without difficulty. Their vital signs are stable without fever or tachycardia. Their hemoglobin had remained stable. Her nausea improved and her oral improved and she met criteria for discharge.. The patient had received discharge instructions and counseling. They were deemed stable for discharge and had met discharge criteria  BP 128/74 (BP Location: Left Arm)   Pulse 70   Temp 98.6 F (37 C) (Oral)   Resp 18   Ht _0  (1.6 m)   Wt 114.4 kg   SpO2 100%   Breastfeeding Yes   BMI 44.68 kg/m   Gen: alert, NAD, non-toxic appearing Pupils: equal, no scleral icterus Pulm: Lungs clear to auscultation, symmetric chest rise CV: regular rate and rhythm Abd: soft, mild approp tender, nondistended.  No cellulitis. No incisional hernia Ext: no edema, no calf tenderness Skin: no rash, no jaundice   Discharge Instructions  Discharge Instructions     Ambulate hourly while awake   Complete by: As directed    Call MD for:  difficulty breathing, headache or visual disturbances   Complete by: As directed    Call MD for:  persistant dizziness or light-headedness   Complete by: As directed    Call MD for:  persistant nausea and vomiting   Complete by: As directed    Call MD for:  redness, tenderness, or signs of infection (pain, swelling, redness, odor or green/yellow discharge around incision site)   Complete by: As directed    Call MD for:  severe uncontrolled pain   Complete by: As directed    Call MD for:  temperature >101 F  Complete by: As directed    Diet bariatric full liquid   Complete by: As directed    Discharge instructions   Complete by: As directed    See bariatric discharge instructions   Incentive spirometry   Complete by: As directed    Perform hourly while awake      Allergies as of 03/22/2022       Reactions   Morphine And Related Itching, Nausea Only        Medication List     TAKE these  medications    acetaminophen 500 MG tablet Commonly known as: TYLENOL Take 2 tablets (1,000 mg total) by mouth every 8 (eight) hours for 5 days.   amphetamine-dextroamphetamine 20 MG tablet Commonly known as: Adderall Take 1 tablet (20 mg total) by mouth daily before breakfast. What changed:  when to take this reasons to take this   amphetamine-dextroamphetamine 20 MG tablet Commonly known as: Adderall Take 1 tablet (20 mg total) by mouth daily before breakfast. What changed: Another medication with the same name was changed. Make sure you understand how and when to take each.   amphetamine-dextroamphetamine 20 MG tablet Commonly known as: Adderall Take 1 tablet (20 mg total) by mouth daily before breakfast. Start taking on: April 09, 2022 What changed: Another medication with the same name was changed. Make sure you understand how and when to take each.   ondansetron 4 MG disintegrating tablet Commonly known as: ZOFRAN-ODT Take 1 tablet (4 mg total) by mouth every 6 (six) hours as needed for nausea or vomiting.   oxyCODONE 5 MG immediate release tablet Commonly known as: Oxy IR/ROXICODONE Take 1 tablet (5 mg total) by mouth every 6 (six) hours as needed for severe pain.   pantoprazole 40 MG tablet Commonly known as: PROTONIX Take 1 tablet (40 mg total) by mouth daily.        Follow-up Information     [name], MD. Go on 04/12/2022.   Specialty: General Surgery Why: at 10:15am. Please arrive 15 minutes prior to your appointment time. Thank you. Contact information: [address] [place] [address] [phone]         [name], [name], [place]. Go on 05/19/2022.   Specialty: General Surgery Why: at 9:45am for Dr. Redmond Pulling. Please arrive 15 minutes prior to your appointment time. Thank you. Contact information: [address] [place] [address] [phone]                  The results of significant diagnostics  from this hospitalization (including imaging, microbiology, ancillary and laboratory) are listed below for reference.    Significant Diagnostic Studies: No results found.  Labs: Basic Metabolic Panel: Recent Labs  Lab 03/20/22 1028 03/21/22 0512  NA  --  136  K  --  3.9  CL  --  108  CO2  --  25  GLUCOSE  --  134*  BUN  --  <5*  CREATININE 0.69 0.60  CALCIUM  --  8.3*   Liver Function Tests: Recent Labs  Lab 03/21/22 0512  AST 17  ALT 21  ALKPHOS 79  BILITOT 0.4  PROT 7.5  ALBUMIN 3.7    CBC: Recent Labs  Lab 03/20/22 1028 03/20/22 1320 03/21/22 0512 03/22/22 0516  WBC 16.7*  --  12.5* 8.0  NEUTROABS  --   --  10.4* 5.7  HGB 12.7 12.7 12.9 12.6  HCT 38.6 38.4 39.1 39.5  MCV 83.5  --  83.4 84.2  PLT 264  --  313 296    CBG: No results for input(s): "GLUCAP" in the last 168 hours.  Principal Problem:   S/P laparoscopic sleeve gastrectomy   Time coordinating discharge: 15 min  Signed:  Gayland Curry, MD [facility], [place] [phone] 03/23/2022, 7:37 AM

## 2022-03-25 ENCOUNTER — Telehealth (HOSPITAL_COMMUNITY): Payer: Self-pay | Admitting: *Deleted

## 2022-03-25 NOTE — Telephone Encounter (Signed)
1.  Tell me about your pain and pain management? Pt denies any pain. Pt states she was "concerned" because she wasn't experiencing pain.  We discussed the expectations of the recovery process and to not have pain can be a normal healing process.   2.  Let's talk about fluid intake.  How much total fluid are you taking in? Pt states that she is getting in at least 60oz of fluid including protein shakes, bottled water, broth, Gatorade Zero and yogurt. Pt instructed to assess status and suggestions daily utilizing Hydration Action Plan on discharge folder and to call [facility] if in the "red zone".   3.  How much protein have you taken in the last 2 days? Pt states she is meeting her goal of 60g of protein each day with the protein shakes.  4.  Have you had nausea?  Tell me about when have experienced nausea and what you did to help? Pt denies nausea. Pt has not experienced nausea since discharge.   5.  Has the frequency or color changed with your urine? Pt states that she is urinating "fine" with no changes in frequency or urgency.     6.  Tell me what your incisions look like? "Incisions look fine". Pt denies a fever, chills.  Pt states incisions are not swollen, open, or draining.  Pt encouraged to call [facility] if incisions change.   7.  Have you been passing gas? BM? Pt states that she is having BMs. Last BM 03/25/22.  Pt states that she has taken Miralax twice.  Discussed the possibility of continuing to need Miralax to prevent constipation.   8.  If a problem or question were to arise who would you call?  Do you know contact numbers for [facility], [facility], and [facility]? Pt denies dehydration symptoms.  Pt can describe s/sx of dehydration.  Pt knows to call [facility] for surgical, [facility] for nutrition, and [facility] for non-urgent questions or concerns.   9.  How has the walking going? Pt states she is walking around and able to be active without difficulty.   10. Are you still using your incentive spirometer?  If so, how  often? Pt states that she is doing the I.S.   Pt encouraged to use incentive spirometer, at least 10x every hour while awake until she sees the surgeon.  11.  How are your vitamins and calcium going?  How are you taking them? Pt states that she is taking her supplements and vitamins without difficulty.  Reminded patient that the first 30 days post-operatively are important for successful recovery.  Practice good hand hygiene, wearing a mask when appropriate (since optional in most places), and minimizing exposure to people who live outside of the home, especially if they are exhibiting any respiratory, GI, or illness-like symptoms.

## 2022-04-04 ENCOUNTER — Encounter: Payer: Self-pay | Admitting: Dietician

## 2022-04-04 ENCOUNTER — Encounter: Payer: BC Managed Care – PPO | Attending: General Surgery | Admitting: Dietician

## 2022-04-04 VITALS — Ht 63.0 in | Wt 241.1 lb

## 2022-04-04 DIAGNOSIS — E669 Obesity, unspecified: Secondary | ICD-10-CM | POA: Diagnosis not present

## 2022-04-04 NOTE — Progress Notes (Addendum)
2 Week Post-Operative Nutrition Class   Patient was seen on 04/04/2022 for Post-Operative Nutrition education at [facility].    Surgery date: 03/20/2022 Surgery type: sleeve  Anthropometrics  Start weight at [facility]: 263.0 lbs (date: 12/16/2021)  Height: 63 in Weight today: 241.1 lbs. BMI: 42.71 kg/m2     Clinical  Medical hx: ADHD, anxiety, pregnancy induced HTN, IBS, kidney stones Medications: Adderall  Labs: 07/29/2021 (right after child birth in the hospital) Hemoglobin 8.3; HCT 25.3 Notable signs/symptoms: none noted Any previous deficiencies? No Bowel Habits: Every day to every other day no complaints    Body Composition Scale 04/04/2022  Current Body Weight 241.1  Total Body Fat % 45.1  Visceral Fat 14  Fat-Free Mass % 54.8   Total Body Water % 41.9  Muscle-Mass lbs 30.9  BMI 42.3  Body Fat Displacement          Torso  lbs 67.4         Left Leg  lbs 13.4         Right Leg  lbs 13.4         Left Arm  lbs 6.7         Right Arm   lbs 6.7      The following the learning objectives were met by the patient during this course: Identifies Phase 3 (Soft, High Proteins) Dietary Goals and will begin from 2 weeks post-operatively to 2 months post-operatively Identifies appropriate sources of fluids and proteins  Identifies appropriate fat sources and healthy verses unhealthy fat types   States protein recommendations and appropriate sources post-operatively Identifies the need for appropriate texture modifications, mastication, and bite sizes when consuming solids Identifies appropriate fat consumption and sources Identifies appropriate multivitamin and calcium sources post-operatively Describes the need for physical activity post-operatively and will follow MD recommendations States when to call healthcare provider regarding medication questions or post-operative complications   Handouts given during class include: Phase 3A: Soft, High  Protein Diet Handout Phase 3 High Protein Meals Healthy Fats   Follow-Up Plan: Patient will follow-up at [facility] in 6 weeks for 2 month post-op nutrition visit for diet advancement per MD.

## 2022-04-10 ENCOUNTER — Telehealth: Payer: Self-pay | Admitting: Dietician

## 2022-04-10 NOTE — Telephone Encounter (Signed)
RD called pt to verify fluid intake once starting soft, solid proteins 2 week post-bariatric surgery.   Daily Fluid intake:  Daily Protein intake:  Bowel Habits:   Concerns/issues:    Left Voice Message 

## 2022-05-12 ENCOUNTER — Encounter: Payer: Self-pay | Admitting: Family Medicine

## 2022-05-14 ENCOUNTER — Other Ambulatory Visit: Payer: Self-pay | Admitting: Family Medicine

## 2022-05-14 MED ORDER — AMPHETAMINE-DEXTROAMPHETAMINE 20 MG PO TABS: 20.0000 mg | ORAL_TABLET | Freq: Two times a day (BID) | ORAL | 0 refills | Status: DC | PRN

## 2022-05-16 ENCOUNTER — Ambulatory Visit: Payer: BC Managed Care – PPO | Admitting: Skilled Nursing Facility1

## 2022-05-17 ENCOUNTER — Encounter: Payer: Self-pay | Admitting: Family Medicine

## 2022-05-17 ENCOUNTER — Telehealth (INDEPENDENT_AMBULATORY_CARE_PROVIDER_SITE_OTHER): Payer: BC Managed Care – PPO | Admitting: Family Medicine

## 2022-05-17 ENCOUNTER — Encounter: Payer: BC Managed Care – PPO | Attending: General Surgery | Admitting: Skilled Nursing Facility1

## 2022-05-17 ENCOUNTER — Telehealth: Payer: BC Managed Care – PPO | Admitting: Family Medicine

## 2022-05-17 ENCOUNTER — Encounter: Payer: Self-pay | Admitting: Skilled Nursing Facility1

## 2022-05-17 VITALS — Ht 62.0 in | Wt 226.3 lb

## 2022-05-17 DIAGNOSIS — E669 Obesity, unspecified: Secondary | ICD-10-CM | POA: Diagnosis not present

## 2022-05-17 DIAGNOSIS — F902 Attention-deficit hyperactivity disorder, combined type: Secondary | ICD-10-CM | POA: Diagnosis not present

## 2022-05-17 NOTE — Progress Notes (Signed)
Bariatric Nutrition Follow-Up Visit Medical Nutrition Therapy   Surgery date: 03/20/2022 Surgery type: sleeve  Anthropometrics  Start weight at [facility]: 263.0 lbs (date: 12/16/2021)  Height: 63 in Weight today: 226.3 lbs. BMI: 41.39 kg/m2     Clinical  Medical hx: ADHD, anxiety, pregnancy induced HTN, IBS, kidney stones Medications: Adderall  Labs: 07/29/2021 (right after child birth in the hospital) Hemoglobin 8.3; HCT 25.3 Notable signs/symptoms: none noted Any previous deficiencies? No Bowel Habits: Every day to every other day no complaints    Body Composition Scale 04/04/2022 05/17/2022  Current Body Weight 241.1 226.3  Total Body Fat % 45.1 44.5  Visceral Fat 14 13  Fat-Free Mass % 54.8 55.4   Total Body Water % 41.9 42.2  Muscle-Mass lbs 30.9 29.7  BMI 42.3 41.2  Body Fat Displacement           Torso  lbs 67.4 62.4         Left Leg  lbs 13.4 12.4         Right Leg  lbs 13.4 12.4         Left Arm  lbs 6.7 6.2         Right Arm   lbs 6.7 6.2   Lifestyle & Dietary Hx  Pt states she feels like she is having a hard time getting in her water, but is definitely getting in all of her protein. Pt states she isn't craving lots of foods. Pt states she is excited about adding new foods in (fruits and vegetables). Pt states she is still not drinking at meals. Pt had questions that were appropriately answered to her satisfaction.  Estimated daily fluid intake: 40-60 oz Estimated daily protein intake: 60 g Supplements: multivitamin and calcium Current average weekly physical activity: walking 4-6 time a week / 30-40 minutes   24-Hr Dietary Recall First Meal: protein shake and coffee Snack: nuts or Starbucks egg bite or [place] bacon Second Meal: tuna packet with mayo Snack: None reported. Third Meal: fish, chicken, or steak   Snack: None reported.  Beverages: water, propel, Gatorade zero   Post-Op Goals/ Signs/ Symptoms Using straws: no Drinking while eating:  no Chewing/swallowing difficulties: no Changes in vision: no Changes to mood/headaches: some Hair loss/changes to skin/nails: some hair loss  Difficulty focusing/concentrating: some - has ADHD Sweating: no Limb weakness: no Dizziness/lightheadedness: no Palpitations: no Carbonated/caffeinated beverages: some N/V/D/C/Gas: no Abdominal pain: no Dumping syndrome: no    NUTRITION DIAGNOSIS  Overweight/obesity ([place]-3.3) related to past poor dietary habits and physical inactivity as evidenced by completed bariatric surgery and following dietary guidelines for continued weight loss and healthy nutrition status.     NUTRITION INTERVENTION Nutrition counseling (C-1) and education (E-2) to facilitate bariatric surgery goals, including: Diet advancement to the next phase now including fruits and vegetables The importance of consuming adequate calories as well as certain nutrients daily due to the body's need for essential vitamins, minerals, and fats The importance of daily physical activity and to reach a goal of at least 150 minutes of moderate to vigorous physical activity weekly (or as directed by their physician) due to benefits such as increased musculature and improved lab values The importance of intuitive eating specifically learning hunger-satiety cues and understanding the importance of learning a new body: The importance of mindful eating to avoid grazing behaviors  Educated pt on the importance of complex carbohydrates in our diet and gave examples of healthy choices.  Educated pt on the importance of eating less processed  foods. Unprocessed foods generally have less sodium, sugars, and saturated fats which can be beneficial for overall health and wellness.  Discussed with pt what each meal should consist of according to the bariatric plate method. Encouraged pt to add variety as she enters new phase (adding fruits and vegetables). Discussed with pt strategies to increase fluid intake  to 64 oz/daily.   Handouts Provided Include  Phase 4 and 6  Learning Style & Readiness for Change Teaching method utilized: Visual & Auditory  Demonstrated degree of understanding via: Teach Back  Readiness Level: action Barriers to learning/adherence to lifestyle change: None identified.  RD's Notes for Next Visit Assess adherence to pt chosen goals   MONITORING & EVALUATION Dietary intake, weekly physical activity, body weight.  Next Steps Patient is to follow-up in 3 months for 5 month post-op follow-up.

## 2022-05-17 NOTE — Progress Notes (Signed)
MyChart Video Visit    Virtual Visit via Video Note   This visit type was conducted due to national recommendations for restrictions regarding the COVID-19 Pandemic (e.g. social distancing) in an effort to limit this patient's exposure and mitigate transmission in our community. This patient is at least at moderate risk for complications without adequate follow up. This format is felt to be most appropriate for this patient at this time. Physical exam was limited by quality of the video and audio technology used for the visit. CMA was able to get the patient set up on a video visit.  Patient location: in parked care in parking lot. Patient and provider in visit Provider location: Office  I discussed the limitations of evaluation and management by telemedicine and the availability of in person appointments. The patient expressed understanding and agreed to proceed.  Visit Date: 05/17/2022  Today's healthcare provider: Wellington Hampshire, MD     Subjective:    Patient ID: Teresa Curry, female    DOB: 04/06/1986, 37 y.o.   MRN: [id number]  Chief Complaint  Patient presents with   Follow-up    3 month follow-up on medication    HPI-opted to keep appt virtual as a lot of URI going thru family.  ADD-pt waited till after gastric sleeve to start.  Doing very well.  Intermitt takes bid. No SE  no SI.  Helping focus, etc.  Gastric sleeve in Nov-doing well!  More energy.  Down >20#!Marland Kitchen    Past Medical History:  Diagnosis Date   ADHD    Anemia    during pregnancy   Anxiety    Headache    migraines   IBS (irritable bowel syndrome)    no longer having sx   Kidney stones    Pregnancy induced hypertension     Past Surgical History:  Procedure Laterality Date   LAPAROSCOPIC GASTRIC SLEEVE RESECTION N/A 03/20/2022   Procedure: LAPAROSCOPIC SLEEVE GASTRECTOMY WITH POSSIBLE HIATAL HERNIA REPAIR;  Surgeon: [name], MD;  Location: [facility];  Service: General;  Laterality: N/A;   UPPER GI  ENDOSCOPY N/A 03/20/2022   Procedure: UPPER GI ENDOSCOPY;  Surgeon: [name], MD;  Location: [facility];  Service: General;  Laterality: N/A;   WISDOM TOOTH EXTRACTION      Outpatient Medications Prior to Visit  Medication Sig Dispense Refill   amphetamine-dextroamphetamine (ADDERALL) 20 MG tablet Take 1 tablet (20 mg total) by mouth 2 (two) times daily as needed (ADHD). 60 tablet 0   pantoprazole (PROTONIX) 40 MG tablet Take 1 tablet (40 mg total) by mouth daily. 90 tablet 0   ondansetron (ZOFRAN-ODT) 4 MG disintegrating tablet Take 1 tablet (4 mg total) by mouth every 6 (six) hours as needed for nausea or vomiting. (Patient not taking: Reported on 05/17/2022) 20 tablet 0   oxyCODONE (OXY IR/ROXICODONE) 5 MG immediate release tablet Take 1 tablet (5 mg total) by mouth every 6 (six) hours as needed for severe pain. (Patient not taking: Reported on 05/17/2022) 10 tablet 0   No facility-administered medications prior to visit.    Allergies  Allergen Reactions   Morphine And Related Itching and Nausea Only        Objective:     Physical Exam  Vitals and nursing note reviewed.  Constitutional:      General:  is not in acute distress.    Appearance: Normal appearance.  HENT:     Head: Normocephalic.  Pulmonary:     Effort: No  respiratory distress.  Skin:    General: Skin is dry.     Coloration: Skin is not pale.  Neurological:     Mental Status: Pt is alert and oriented to person, place, and time.  Psychiatric:        Mood and Affect: Mood normal.   There were no vitals taken for this visit.  Wt Readings from Last 3 Encounters:  05/17/22 226 lb 4.8 oz (102.6 kg)  04/04/22 241 lb 1.6 oz (109.4 kg)  03/20/22 252 lb 3.2 oz (114.4 kg)       Assessment & Plan:   Problem List Items Addressed This Visit       Other   Attention deficit hyperactivity disorder (ADHD), combined type - Primary  1  ADD-chronic.  Doing well on Adderall 20mg  daily to bid.  Going to pick up rx today.   Will call/message when needs refill.  F/u 4 mo    No orders of the defined types were placed in this encounter.   I discussed the assessment and treatment plan with the patient. The patient was provided an opportunity to ask questions and all were answered. The patient agreed with the plan and demonstrated an understanding of the instructions.   The patient was advised to call back or seek an in-person evaluation if the symptoms worsen or if the condition fails to improve as anticipated.  I provided 10 minutes of face-to-face time during this encounter.   Wellington Hampshire, MD [place] [phone] (phone) [phone] (fax)  [place]

## 2022-05-17 NOTE — Patient Instructions (Signed)
It was very nice to see you today!  Happy New Year!  So glad you are doing so well.  Can't wait to see you and your continued success in 4 months!   PLEASE NOTE:  If you had any lab tests please let us know if you have not heard back within a few days. You may see your results on MyChart before we have a chance to review them but we will give you a call once they are reviewed by [place]. If we ordered any referrals today, please let us know if you have not heard from their office within the next week.   Please try these tips to maintain a healthy lifestyle:  Eat most of your calories during the day when you are active. Eliminate processed foods including packaged sweets (pies, cakes, cookies), reduce intake of potatoes, white bread, white pasta, and white rice. Look for whole grain options, oat flour or almond flour.  Each meal should contain half fruits/vegetables, one quarter protein, and one quarter carbs (no bigger than a computer mouse).  Cut down on sweet beverages. This includes juice, soda, and sweet tea. Also watch fruit intake, though this is a healthier sweet option, it still contains natural sugar! Limit to 3 servings daily.  Drink at least 1 glass of water with each meal and aim for at least 8 glasses per day  Exercise at least 150 minutes every week.

## 2022-05-18 NOTE — Progress Notes (Signed)
Seen on same day-see note.  Not sure how this happened.  Erroneous encounter

## 2022-07-07 ENCOUNTER — Telehealth: Payer: Self-pay | Admitting: Family Medicine

## 2022-07-07 ENCOUNTER — Other Ambulatory Visit: Payer: Self-pay | Admitting: Family Medicine

## 2022-07-07 MED ORDER — AMPHETAMINE-DEXTROAMPHETAMINE 20 MG PO TABS: 20.0000 mg | ORAL_TABLET | Freq: Two times a day (BID) | ORAL | 0 refills | Status: DC | PRN

## 2022-07-07 NOTE — Telephone Encounter (Signed)
Please see message below

## 2022-07-07 NOTE — Telephone Encounter (Signed)
   LAST APPOINTMENT DATE: 05/17/22  NEXT APPOINTMENT DATE: None  MEDICATION: amphetamine-dextroamphetamine (ADDERALL) 20 MG tablet   Is the patient out of medication? Yes  PHARMACY:  [id number] [place], [place] - [id number]: [phone] Fax: [phone]

## 2022-08-15 ENCOUNTER — Telehealth: Payer: Self-pay | Admitting: Family Medicine

## 2022-08-15 ENCOUNTER — Other Ambulatory Visit: Payer: Self-pay | Admitting: Family Medicine

## 2022-08-15 MED ORDER — AMPHETAMINE-DEXTROAMPHETAMINE 20 MG PO TABS: 20.0000 mg | ORAL_TABLET | Freq: Two times a day (BID) | ORAL | 0 refills | Status: DC | PRN

## 2022-08-15 NOTE — Telephone Encounter (Signed)
  Encourage patient to contact the pharmacy for refills or they can request refills through [facility]:  Please schedule appointment if longer than 1 year  NEXT APPOINTMENT DATE:  MEDICATION:  amphetamine-dextroamphetamine (ADDERALL) 20 MG tablet   Is the patient out of medication? ALMOST  PHARMACY: [id number] - [place], [place] - [address]  [address] [place], [place] [address]   Let patient know to contact pharmacy at the end of the day to make sure medication is ready.  Please notify patient to allow 48-72 hours to process

## 2022-08-21 ENCOUNTER — Encounter: Payer: Self-pay | Admitting: Skilled Nursing Facility1

## 2022-08-21 ENCOUNTER — Encounter: Payer: BC Managed Care – PPO | Attending: General Surgery | Admitting: Skilled Nursing Facility1

## 2022-08-21 VITALS — Ht 63.0 in | Wt 197.8 lb

## 2022-08-21 DIAGNOSIS — E669 Obesity, unspecified: Secondary | ICD-10-CM | POA: Diagnosis not present

## 2022-08-21 NOTE — Progress Notes (Signed)
Bariatric Nutrition Follow-Up Visit Medical Nutrition Therapy   Surgery date: 03/20/2022 Surgery type: sleeve  Anthropometrics  Start weight at [facility]: 263.0 lbs (date: 12/16/2021)  Height: 63 in Weight today: 197.8   Clinical  Medical hx: ADHD, anxiety, pregnancy induced HTN, IBS, kidney stones Medications: Adderall  Labs: 07/29/2021 (right after child birth in the hospital) Hemoglobin 8.3; HCT 25.3 Notable signs/symptoms: none noted Any previous deficiencies? No Bowel Habits: Every day to every other day no complaints    Body Composition Scale 04/04/2022 05/17/2022 08/21/2022  Current Body Weight 241.1 226.3 197.8  Total Body Fat % 45.1 44.5 39.8  Visceral Fat 14 13 10   Fat-Free Mass % 54.8 55.4 60.1   Total Body Water % 41.9 42.2 44.5  Muscle-Mass lbs 30.9 29.7 30.4  BMI 42.3 41.2 34.6  Body Fat Displacement            Torso  lbs 67.4 62.4 48.7         Left Leg  lbs 13.4 12.4 9.7         Right Leg  lbs 13.4 12.4 9.7         Left Arm  lbs 6.7 6.2 4.8         Right Arm   lbs 6.7 6.2 4.8   Lifestyle & Dietary Hx  Pt states things are going well.   Estimated daily fluid intake: 50-55 oz Estimated daily protein intake: 60 g Supplements: multivitamin and calcium Current average weekly physical activity: ADL's  24-Hr Dietary Recall First Meal: 1/2 protein shake after 1 hour of waking or protein water Snack: nuts or [employer] egg bite or [place] bacon Second Meal: other half of protein shake Snack: protein chips or nuts Third Meal: 2-3 oz and cauliflower or carrots  Snack: protein peanut butter cups Beverages: water, propel, Gatorade zero, some alcohol    Post-Op Goals/ Signs/ Symptoms Using straws: no Drinking while eating: no Chewing/swallowing difficulties: no Changes in vision: no Changes to mood/headaches: some Hair loss/changes to skin/nails: some hair loss  Difficulty focusing/concentrating: some - has ADHD Sweating: no Limb weakness:  no Dizziness/lightheadedness: no Palpitations: no Carbonated/caffeinated beverages: some N/V/D/C/Gas: no Abdominal pain: no Dumping syndrome: no    NUTRITION DIAGNOSIS  Overweight/obesity ([place]-3.3) related to past poor dietary habits and physical inactivity as evidenced by completed bariatric surgery and following dietary guidelines for continued weight loss and healthy nutrition status.     NUTRITION INTERVENTION Nutrition counseling (C-1) and education (E-2) to facilitate bariatric surgery goals, including: Diet advancement to the next phase now including fruits and vegetables The importance of consuming adequate calories as well as certain nutrients daily due to the body's need for essential vitamins, minerals, and fats The importance of daily physical activity and to reach a goal of at least 150 minutes of moderate to vigorous physical activity weekly (or as directed by their physician) due to benefits such as increased musculature and improved lab values The importance of intuitive eating specifically learning hunger-satiety cues and understanding the importance of learning a new body: The importance of mindful eating to avoid grazing behaviors  Educated pt on the importance of complex carbohydrates in our diet and gave examples of healthy choices.  Educated pt on the importance of eating less processed foods. Unprocessed foods generally have less sodium, sugars, and saturated fats which can be beneficial for overall health and wellness.  Discussed with pt what each meal should consist of according to the bariatric plate method. Encouraged pt to add variety as  she enters new phase (adding fruits and vegetables). Discussed with pt strategies to increase fluid intake to 64 oz/daily.   Educated pt on the importance of consistently eating throughout the day: Sustained Energy Levels: Regular meals and snacks help maintain stable blood sugar levels, providing a steady source of energy  throughout the day. This can prevent energy crashes and help you stay focused and alert. Metabolism Regulation: Eating regularly can help keep your metabolism active and functioning efficiently. Consistent meals can prevent your body from going into "starvation mode," where it conserves energy and slows down metabolism. Nutrient Absorption: Spacing out meals allows your body to better absorb and utilize nutrients from food. Eating throughout the day ensures that your body receives a continuous supply of essential vitamins, minerals, and other nutrients. Appetite Control: Regular meals and snacks can help regulate appetite and prevent overeating. When you go too long without eating, you may become overly hungry and more likely to overindulge or make unhealthy food choices. Blood Sugar Regulation: Eating at regular intervals helps prevent spikes and crashes in blood sugar levels. This is particularly important for individuals with diabetes or those at risk of developing diabetes. Improved Mood and Concentration: Balanced, regular meals can positively impact your mood and cognitive function. Stable blood sugar levels support brain function, leading to better concentration, memory, and overall mental well-being. Muscle Maintenance: Consuming protein-rich snacks or meals throughout the day can help support muscle repair and maintenance, especially for individuals who are physically active or engaged in strength training. Overall, eating throughout the day supports your body's needs for energy, nutrients, and overall health, promoting a balanced and sustainable approach to nutrition   Handouts Provided Include  Phase 6 months  Learning Style & Readiness for Change Teaching method utilized: Visual & Auditory  Demonstrated degree of understanding via: Teach Back  Readiness Level: action Barriers to learning/adherence to lifestyle change: None identified.  RD's Notes for Next Visit Assess adherence to pt  chosen goals   MONITORING & EVALUATION Dietary intake, weekly physical activity, body weight.  Next Steps Patient is to follow-up in August

## 2022-10-18 ENCOUNTER — Other Ambulatory Visit: Payer: Self-pay | Admitting: Family Medicine

## 2022-10-18 ENCOUNTER — Telehealth: Payer: Self-pay | Admitting: Family Medicine

## 2022-10-18 MED ORDER — AMPHETAMINE-DEXTROAMPHETAMINE 20 MG PO TABS: 20.0000 mg | ORAL_TABLET | Freq: Two times a day (BID) | ORAL | 0 refills | Status: DC | PRN

## 2022-10-18 NOTE — Telephone Encounter (Signed)
Prescription Request  10/18/2022  LOV: 02/08/2022--VV 05/17/22  What is the name of the medication or equipment? amphetamine-dextroamphetamine (ADDERALL) 20 MG tablet    Have you contacted your pharmacy to request a refill? Yes   Which pharmacy would you like this sent to?  [id number] - [place], [place] - [address] [place] [address] Phone: [phone] Fax: [phone]    Patient notified that their request is being sent to the clinical staff for review and that they should receive a response within 2 business days.   Please advise at Mobile [phone] (mobile)

## 2022-11-15 ENCOUNTER — Ambulatory Visit: Payer: 59 | Admitting: Family Medicine

## 2022-11-15 VITALS — BP 100/62 | HR 78 | Temp 98.9°F | Resp 18 | Ht 63.0 in | Wt 182.5 lb

## 2022-11-15 DIAGNOSIS — Z9884 Bariatric surgery status: Secondary | ICD-10-CM | POA: Diagnosis not present

## 2022-11-15 DIAGNOSIS — F902 Attention-deficit hyperactivity disorder, combined type: Secondary | ICD-10-CM | POA: Diagnosis not present

## 2022-11-15 MED ORDER — AMPHETAMINE-DEXTROAMPHETAMINE 20 MG PO TABS: 20.0000 mg | ORAL_TABLET | Freq: Every day | ORAL | 0 refills | Status: DC

## 2022-11-15 NOTE — Progress Notes (Signed)
Subjective:     Patient ID: Teresa Curry, female    DOB: [birth year], 37 y.o.   MRN: [id number]  Chief Complaint  Patient presents with   Medication Follow-up    Follow-up on ADHD medication     HPI ADHD - She has been taking 1.5 tabs of Adderall once a day, 30 mg total.  ADHD is well managed, she has no concerns. Denies any SI.  Weight management - Gastric sleeve resection was on 03/20/2022. She has been eating healthy and exercising regularly. She has lost about 70 lbs and feels the difference.  Vitamins -  Has been taking bariatric advantage multivitamins with iron.   There are no preventive care reminders to display for this patient.   Past Medical History:  Diagnosis Date   ADHD    Anemia    during pregnancy   Anxiety    Headache    migraines   IBS (irritable bowel syndrome)    no longer having sx   Kidney stones    Pregnancy induced hypertension     Past Surgical History:  Procedure Laterality Date   LAPAROSCOPIC GASTRIC SLEEVE RESECTION N/A 03/20/2022   Procedure: LAPAROSCOPIC SLEEVE GASTRECTOMY WITH POSSIBLE HIATAL HERNIA REPAIR;  Surgeon: Gaynelle Adu, MD;  Location: [facility];  Service: General;  Laterality: N/A;   UPPER GI ENDOSCOPY N/A 03/20/2022   Procedure: UPPER GI ENDOSCOPY;  Surgeon: Gaynelle Adu, MD;  Location: [facility];  Service: General;  Laterality: N/A;   WISDOM TOOTH EXTRACTION       Current Outpatient Medications:    amphetamine-dextroamphetamine (ADDERALL) 20 MG tablet, Take 1 tablet (20 mg total) by mouth 2 (two) times daily as needed (ADHD)., Disp: 60 tablet, Rfl: 0   amphetamine-dextroamphetamine (ADDERALL) 20 MG tablet, Take 1 tablet (20 mg total) by mouth daily before breakfast., Disp: 30 tablet, Rfl: 0   [START ON 12/15/2022] amphetamine-dextroamphetamine (ADDERALL) 20 MG tablet, Take 1 tablet (20 mg total) by mouth daily before breakfast., Disp: 30 tablet, Rfl: 0   [START ON 01/14/2023] amphetamine-dextroamphetamine (ADDERALL) 20 MG tablet, Take 1  tablet (20 mg total) by mouth daily before breakfast., Disp: 30 tablet, Rfl: 0   Multiple Vitamins-Minerals (BARIATRIC MULTIVITAMINS/IRON PO), Take by mouth., Disp: , Rfl:   Allergies  Allergen Reactions   Morphine And Codeine Itching and Nausea Only   Morphine Itching and Other (See Comments)   ROS neg/noncontributory except as noted HPI/below      Objective:     BP 100/62   Pulse 78   Temp 98.9 F (37.2 C) (Temporal)   Resp 18   Ht 5\' 3"  (1.6 m)   Wt 182 lb 8 oz (82.8 kg)   LMP 11/15/2022 (Exact Date)   SpO2 98%   Breastfeeding No   BMI 32.33 kg/m  Wt Readings from Last 3 Encounters:  11/15/22 182 lb 8 oz (82.8 kg)  08/21/22 197 lb 12.8 oz (89.7 kg)  05/17/22 226 lb 4.8 oz (102.6 kg)    Physical Exam   Gen: WDWN NAD HEENT: NCAT, conjunctiva not injected, sclera nonicteric CARDIAC: RRR, S1S2+, no murmur. DP 2+B LUNGS: CTAB. No wheezes NEURO: A&O x3.  CN II-XII intact.  PSYCH: normal mood. Good eye contact  Pdmp checked     Assessment & Plan:  Attention deficit hyperactivity disorder (ADHD), combined type Assessment & Plan: Chronic.  Well controlled.  Continue adderall 20mg  bid(occ takes 20 and 10).  PDMP checked  Orders: -     DRUG MONITORING, PANEL 8  WITH CONFIRMATION, URINE  S/P laparoscopic sleeve gastrectomy Assessment & Plan: Chronic.  Doing very well with wt loss-feeling great.    Other orders -     Amphetamine-Dextroamphetamine; Take 1 tablet (20 mg total) by mouth daily before breakfast.  Dispense: 30 tablet; Refill: 0 -     Amphetamine-Dextroamphetamine; Take 1 tablet (20 mg total) by mouth daily before breakfast.  Dispense: 30 tablet; Refill: 0 -     Amphetamine-Dextroamphetamine; Take 1 tablet (20 mg total) by mouth daily before breakfast.  Dispense: 30 tablet; Refill: 0    Return in about 3 months (around 02/15/2023) for add.   I,Rachel Rivera,acting as a scribe for Angelena Sole, MD.,have documented all relevant documentation on the  behalf of Angelena Sole, MD,as directed by  Angelena Sole, MD while in the presence of Angelena Sole, MD.  I, Angelena Sole, MD, have reviewed all documentation for this visit. The documentation on 11/15/22 for the exam, diagnosis, procedures, and orders are all accurate and complete.    Angelena Sole, MD

## 2022-11-15 NOTE — Assessment & Plan Note (Signed)
Chronic.  Well controlled.  Continue adderall 20mg  bid(occ takes 20 and 10).  PDMP checked

## 2022-11-15 NOTE — Assessment & Plan Note (Signed)
Chronic.  Doing very well with wt loss-feeling great.

## 2022-11-15 NOTE — Patient Instructions (Signed)
It was very nice to see you today!  Great job   PLEASE NOTE:  If you had any lab tests please let us know if you have not heard back within a few days. You may see your results on MyChart before we have a chance to review them but we will give you a call once they are reviewed by us. If we ordered any referrals today, please let us know if you have not heard from their office within the next week.   Please try these tips to maintain a healthy lifestyle:  Eat most of your calories during the day when you are active. Eliminate processed foods including packaged sweets (pies, cakes, cookies), reduce intake of potatoes, white bread, white pasta, and white rice. Look for whole grain options, oat flour or almond flour.  Each meal should contain half fruits/vegetables, one quarter protein, and one quarter carbs (no bigger than a computer mouse).  Cut down on sweet beverages. This includes juice, soda, and sweet tea. Also watch fruit intake, though this is a healthier sweet option, it still contains natural sugar! Limit to 3 servings daily.  Drink at least 1 glass of water with each meal and aim for at least 8 glasses per day  Exercise at least 150 minutes every week.   

## 2022-11-17 LAB — DRUG MONITORING, PANEL 8 WITH CONFIRMATION, URINE
6 Acetylmorphine: NEGATIVE ng/mL (ref <10)
Alcohol Metabolites: NEGATIVE ng/mL (ref <500)
Amphetamines: NEGATIVE ng/mL (ref <500)
Benzodiazepines: NEGATIVE ng/mL (ref <100)
Buprenorphine, Urine: NEGATIVE ng/mL (ref <5)
Cocaine Metabolite: NEGATIVE ng/mL (ref <150)
Creatinine: 168.5 mg/dL (ref >=20.0)
MDMA: NEGATIVE ng/mL (ref <500)
Marijuana Metabolite: NEGATIVE ng/mL (ref <20)
Opiates: NEGATIVE ng/mL (ref <100)
Oxidant: NEGATIVE ug/mL (ref <200)
Oxycodone: NEGATIVE ng/mL (ref <100)
pH: 7.5 (ref 4.5–9.0)

## 2022-11-17 LAB — DM TEMPLATE

## 2022-11-19 NOTE — Progress Notes (Signed)
Should have seen adderall in drug screen.  When had she taken it last?  Also, just noticed I didn't send the adderall instructions in right so she will run out too soon-she'll need to message [place] when low and I will correct then.  Sorry.

## 2022-12-04 ENCOUNTER — Telehealth: Payer: Self-pay | Admitting: Family Medicine

## 2022-12-04 ENCOUNTER — Other Ambulatory Visit: Payer: Self-pay | Admitting: Family Medicine

## 2022-12-04 MED ORDER — AMPHETAMINE-DEXTROAMPHETAMINE 30 MG PO TABS: 30.0000 mg | ORAL_TABLET | Freq: Every day | ORAL | 0 refills | Status: DC

## 2022-12-04 NOTE — Telephone Encounter (Signed)
Patient called in requesting refill of adderall. I informed pt that she should be ready for refill on 8/2 as the rx order states. She informed me that she has been out of the medication for about a week since she was on vacation. I asked patient when was the last time she picked up her meds and she stated in June. I advised patient to call pharmacy since rx order have been placed until September. Pt verbalized understanding but still wanted message sent back to pcp and team.

## 2022-12-05 NOTE — Telephone Encounter (Signed)
Left detailed message informing patient of message below.

## 2022-12-19 ENCOUNTER — Encounter: Payer: 59 | Attending: General Surgery | Admitting: Skilled Nursing Facility1

## 2022-12-19 ENCOUNTER — Encounter: Payer: Self-pay | Admitting: Skilled Nursing Facility1

## 2022-12-19 VITALS — Ht 63.0 in | Wt 174.8 lb

## 2022-12-19 DIAGNOSIS — E669 Obesity, unspecified: Secondary | ICD-10-CM | POA: Insufficient documentation

## 2022-12-19 NOTE — Progress Notes (Signed)
Bariatric Nutrition Follow-Up Visit Medical Nutrition Therapy   Surgery date: 03/20/2022 Surgery type: sleeve  Anthropometrics  Start weight at [facility]: 263.0 lbs (date: 12/16/2021)  Height: 63 in Weight today: 174.8   Clinical  Medical hx: ADHD, anxiety, pregnancy induced HTN, IBS, kidney stones Medications: Adderall  Labs: 07/29/2021 (right after child birth in the hospital) Hemoglobin 8.3; HCT 25.3 Notable signs/symptoms: none noted Any previous deficiencies? No Bowel Habits: Every day to every other day no complaints    Body Composition Scale 05/17/2022 08/21/2022 12/19/2022  Current Body Weight 226.3 197.8 174.8  Total Body Fat % 44.5 39.8 35.8  Visceral Fat 13 10 8   Fat-Free Mass % 55.4 60.1 64.1   Total Body Water % 42.2 44.5 46.5  Muscle-Mass lbs 29.7 30.4 30.3  BMI 41.2 34.6 30.6  Body Fat Displacement            Torso  lbs 62.4 48.7 38.7         Left Leg  lbs 12.4 9.7 7.7         Right Leg  lbs 12.4 9.7 7.7         Left Arm  lbs 6.2 4.8 3.8         Right Arm   lbs 6.2 4.8 3.8   Lifestyle & Dietary Hx  Pt states she recently left her job so she has more time to dedicate to self care.  Pt states her insurance changed so she may not be able to come to these visits any longer but will try as she likes the accountability.    Estimated daily fluid intake: 40-45 oz Estimated daily protein intake: 60 g Supplements: multivitamin and calcium Current average weekly physical activity: 3-4 days a week on a walking pad   24-Hr Dietary Recall First Meal: 1/2 protein shake  or protein bar Snack: yogurt with granola or fruit Second Meal: tuna + quest chips or leftovers Snack: protein chips  Third Meal: fish or chicken or steak + rice or potato or squash  Snack: other hal for protein shake or protein bar Beverages: water, propel, Gatorade zero, some alcohol    Post-Op Goals/ Signs/ Symptoms Using straws: no Drinking while eating: no Chewing/swallowing difficulties:  no Changes in vision: no Changes to mood/headaches: some Hair loss/changes to skin/nails: some hair loss  Difficulty focusing/concentrating: some - has ADHD Sweating: no Limb weakness: no Dizziness/lightheadedness: no Palpitations: no Carbonated/caffeinated beverages: some N/V/D/C/Gas: no Abdominal pain: no Dumping syndrome: no    NUTRITION DIAGNOSIS  Overweight/obesity ([place]-3.3) related to past poor dietary habits and physical inactivity as evidenced by completed bariatric surgery and following dietary guidelines for continued weight loss and healthy nutrition status.     NUTRITION INTERVENTION Nutrition counseling (C-1) and education (E-2) to facilitate bariatric surgery goals, including: Diet advancement to the next phase now including fruits and vegetables The importance of consuming adequate calories as well as certain nutrients daily due to the body's need for essential vitamins, minerals, and fats The importance of daily physical activity and to reach a goal of at least 150 minutes of moderate to vigorous physical activity weekly (or as directed by their physician) due to benefits such as increased musculature and improved lab values The importance of intuitive eating specifically learning hunger-satiety cues and understanding the importance of learning a new body: The importance of mindful eating to avoid grazing behaviors  Educated pt on the importance of complex carbohydrates in our diet and gave examples of healthy choices.  Educated pt  on the importance of eating less processed foods. Unprocessed foods generally have less sodium, sugars, and saturated fats which can be beneficial for overall health and wellness.  Discussed with pt what each meal should consist of according to the bariatric plate method. Encouraged pt to add variety as she enters new phase (adding fruits and vegetables). Discussed with pt strategies to increase fluid intake to 64 oz/daily.  Creation of balanced  and diverse meals to increase the intake of nutrient-rich foods that provide essential vitamins, minerals, fiber, and phytonutrients  Variety of Fruits and Vegetables:  Aim for a colorful array of fruits and vegetables to ensure a wide range of nutrients. Include a mix of leafy greens, berries, citrus fruits, cruciferous vegetables, and more. Whole Grains: Choose whole grains over refined grains. Examples include brown rice, quinoa, oats, whole wheat, and barley. Lean Proteins: Include lean sources of protein, such as poultry, fish, tofu, legumes, beans, lentils, and low-fat dairy products. Limit red and processed meats. Healthy Fats: Incorporate sources of healthy fats, including avocados, nuts, seeds, and olive oil. Limit saturated and trans fats found in fried and processed foods. Dairy or Dairy Alternatives: Choose low-fat or fat-free dairy products, or plant-based alternatives like almond or soy milk. Portion Control: Be mindful of portion sizes to avoid overeating. Pay attention to hunger and satisfaction cues. Limit Added Sugars: Minimize the consumption of sugary beverages, snacks, and desserts. Check food labels for added sugars and opt for natural sources of sweetness such as whole fruits. Hydration: Drink plenty of water throughout the day. Limit sugary drinks and excessive caffeine intake. Moderate Sodium Intake: Reduce the consumption of high-sodium foods. Use herbs and spices for flavor instead of excessive salt. Meal Planning and Preparation: Plan and prepare meals ahead of time to make healthier choices more convenient. Include a mix of food groups in each meal. Limit Processed Foods: Minimize the intake of highly processed and packaged foods that are often high in added sugars, salt, and unhealthy fats. Regular Physical Activity: Combine a healthy diet with regular physical activity for overall well-being. Aim for at least 150 minutes of moderate-intensity aerobic  exercise per week, along with strength training. Moderation and Balance: Enjoy treats and indulgent foods in moderation, emphasizing balance rather than strict restriction.   Handouts Provided Include  Phase 1 year  Learning Style & Readiness for Change Teaching method utilized: Visual & Auditory  Demonstrated degree of understanding via: Teach Back  Readiness Level: action Barriers to learning/adherence to lifestyle change: None identified.  RD's Notes for Next Visit Assess adherence to pt chosen goals   MONITORING & EVALUATION Dietary intake, weekly physical activity, body weight.  Next Steps Patient is to follow-up: pt is to call or email with any furture questions or concerns

## 2023-01-02 ENCOUNTER — Other Ambulatory Visit: Payer: Self-pay | Admitting: Family Medicine

## 2023-01-02 ENCOUNTER — Telehealth: Payer: Self-pay | Admitting: Family Medicine

## 2023-01-02 MED ORDER — AMPHETAMINE-DEXTROAMPHETAMINE 20 MG PO TABS: 20.0000 mg | ORAL_TABLET | Freq: Two times a day (BID) | ORAL | 0 refills | Status: DC

## 2023-01-02 NOTE — Progress Notes (Signed)
My error.  Misunderstood how pt was taking adderall.  20mg  bid is correct.

## 2023-01-02 NOTE — Telephone Encounter (Signed)
Patient stated that she has been taking 20 mg twice daily.

## 2023-01-02 NOTE — Telephone Encounter (Signed)
PT STATES THE RX SUPPOSED TO BE 60 - 1 TAB TWICE A DAY BUT LAST RX WAS ONLY FOR 30 DAYS - 1 A DAY. PLEASE ADVISE.    Prescription Request  01/02/2023  LOV: 11/15/2022  What is the name of the medication or equipment?  amphetamine-dextroamphetamine (ADDERALL) 30 MG tablet   Have you contacted your pharmacy to request a refill? No   Which pharmacy would you like this sent to? [id number] - [place], [place] - [address] [place] [address] Phone: [phone] Fax: [phone]   Patient notified that their request is being sent to the clinical staff for review and that they should receive a response within 2 business days.   Please advise at Mobile [phone] (mobile)

## 2023-01-02 NOTE — Telephone Encounter (Signed)
Left message to return my call.  

## 2023-01-02 NOTE — Telephone Encounter (Signed)
Pt would like a call back---see message below

## 2023-01-02 NOTE — Telephone Encounter (Signed)
Please see message below

## 2023-01-03 NOTE — Telephone Encounter (Signed)
Noted  

## 2023-02-13 ENCOUNTER — Other Ambulatory Visit: Payer: Self-pay | Admitting: Family Medicine

## 2023-02-13 ENCOUNTER — Telehealth: Payer: Self-pay | Admitting: Family Medicine

## 2023-02-13 MED ORDER — AMPHETAMINE-DEXTROAMPHETAMINE 20 MG PO TABS: 20.0000 mg | ORAL_TABLET | Freq: Two times a day (BID) | ORAL | 0 refills | Status: DC

## 2023-02-13 NOTE — Telephone Encounter (Signed)
Please see message below

## 2023-02-13 NOTE — Telephone Encounter (Signed)
Prescription Request  02/13/2023  LOV: 11/15/2022  What is the name of the medication or equipment?  amphetamine-dextroamphetamine (ADDERALL) 20 MG tablet    Have you contacted your pharmacy to request a refill? Yes   Which pharmacy would you like this sent to? [id number] - [place] - [address] [place] [address] Phone: [phone] Fax: [phone]   Patient notified that their request is being sent to the clinical staff for review and that they should receive a response within 2 business days.   Please advise at Mobile [phone] (mobile)

## 2023-02-14 ENCOUNTER — Encounter: Payer: Self-pay | Admitting: *Deleted

## 2023-02-14 NOTE — Telephone Encounter (Signed)
Patient notified of message below.

## 2023-06-18 ENCOUNTER — Ambulatory Visit: Payer: 59 | Admitting: Family Medicine

## 2023-06-26 ENCOUNTER — Ambulatory Visit: Payer: 59 | Admitting: Family Medicine

## 2023-06-26 VITALS — BP 118/60 | HR 89 | Temp 98.6°F | Resp 16 | Ht 63.0 in | Wt 150.4 lb

## 2023-06-26 DIAGNOSIS — R110 Nausea: Secondary | ICD-10-CM | POA: Diagnosis not present

## 2023-06-26 DIAGNOSIS — H6991 Unspecified Eustachian tube disorder, right ear: Secondary | ICD-10-CM

## 2023-06-26 DIAGNOSIS — F902 Attention-deficit hyperactivity disorder, combined type: Secondary | ICD-10-CM | POA: Diagnosis not present

## 2023-06-26 MED ORDER — MEDROXYPROGESTERONE ACETATE 5 MG PO TABS: 5.0000 mg | ORAL_TABLET | Freq: Three times a day (TID) | ORAL | 0 refills | Status: DC

## 2023-06-26 MED ORDER — AMPHETAMINE-DEXTROAMPHETAMINE 20 MG PO TABS: 20.0000 mg | ORAL_TABLET | Freq: Two times a day (BID) | ORAL | 0 refills | Status: DC

## 2023-06-26 MED ORDER — ONDANSETRON HCL 4 MG PO TABS: 4.0000 mg | ORAL_TABLET | Freq: Three times a day (TID) | ORAL | 1 refills | Status: AC | PRN

## 2023-06-26 NOTE — Progress Notes (Signed)
Subjective:     Patient ID: Teresa Curry, female    DOB: 1985-06-23, 38 y.o.   MRN: [id number]  Chief Complaint  Patient presents with   Follow-up    ADHD Follow-up Right ear feels stopped up off and on     HPI Discussed the use of AI scribe software for clinical note transcription with the patient, who gave verbal consent to proceed.  History of Present Illness   Teresa Curry is a 38 year old female who presents with right ear symptoms.  She has experienced right ear symptoms for the past three to four weeks, described as a sensation of drainage and feeling like a water bubble. These symptoms are intermittent and were initially associated with sinus issues, which improved with sinus medication. There is no pain, only a sensation of being stopped up.  Approximately one year ago, she underwent gastric sleeve surgery and has since lost a total of 100 pounds, with 32 pounds lost since July. She feels great and has reached her goal weight, although she notes a plateau in her weight loss and the presence of excess skin. She exercises about three times a week, primarily walking and using light weights, but has been limited by having a child homesick frequently.  She is currently taking Adderall 20 mg twice a day for ADHD, with a reduced dose on weekends. The medication is effective without side effects such as heart palpitations, shakiness, or nausea. She mentions a previous drug screen in July that showed no Adderall in her system, which she attributes to high fluid intake. out of meds now.  She is aware of the need to manage her medication refills due to [facility] regulations.  She experiences car sickness, particularly when traveling long distances, and requests Zofran to manage this. She is sensitive to Dramamine, which causes significant drowsiness, and has tried sea bands without success. The car sickness began after having children and is exacerbated when sitting in the back seat.   ? if  something to help w/delaying menses as due when on vacation.       There are no preventive care reminders to display for this patient.  Past Medical History:  Diagnosis Date   ADHD    Anemia    during pregnancy   Anxiety    Headache    migraines   IBS (irritable bowel syndrome)    no longer having sx   Kidney stones    Pregnancy induced hypertension     Past Surgical History:  Procedure Laterality Date   LAPAROSCOPIC GASTRIC SLEEVE RESECTION N/A 03/20/2022   Procedure: LAPAROSCOPIC SLEEVE GASTRECTOMY WITH POSSIBLE HIATAL HERNIA REPAIR;  Surgeon: Gaynelle Adu, MD;  Location: [facility];  Service: General;  Laterality: N/A;   UPPER GI ENDOSCOPY N/A 03/20/2022   Procedure: UPPER GI ENDOSCOPY;  Surgeon: Gaynelle Adu, MD;  Location: [facility];  Service: General;  Laterality: N/A;   WISDOM TOOTH EXTRACTION       Current Outpatient Medications:    amphetamine-dextroamphetamine (ADDERALL) 20 MG tablet, Take 1 tablet (20 mg total) by mouth 2 (two) times daily., Disp: 60 tablet, Rfl: 0   amphetamine-dextroamphetamine (ADDERALL) 20 MG tablet, Take 1 tablet (20 mg total) by mouth 2 (two) times daily., Disp: 60 tablet, Rfl: 0   [START ON 07/26/2023] amphetamine-dextroamphetamine (ADDERALL) 20 MG tablet, Take 1 tablet (20 mg total) by mouth 2 (two) times daily., Disp: 60 tablet, Rfl: 0   [START ON 08/25/2023] amphetamine-dextroamphetamine (ADDERALL) 20 MG tablet, Take  1 tablet (20 mg total) by mouth 2 (two) times daily., Disp: 60 tablet, Rfl: 0   Multiple Vitamins-Minerals (BARIATRIC MULTIVITAMINS/IRON PO), Take by mouth., Disp: , Rfl:    ondansetron (ZOFRAN) 4 MG tablet, Take 1 tablet (4 mg total) by mouth every 8 (eight) hours as needed for nausea or vomiting., Disp: 20 tablet, Rfl: 1  Allergies  Allergen Reactions   Morphine And Codeine Itching and Nausea Only   Morphine Itching and Other (See Comments)   ROS neg/noncontributory except as noted HPI/below      Objective:     BP 118/60    Pulse 89   Temp 98.6 F (37 C) (Temporal)   Resp 16   Ht 5\' 3"  (1.6 m)   Wt 150 lb 6 oz (68.2 kg)   LMP 05/28/2023 (Exact Date)   SpO2 99%   Breastfeeding No   BMI 26.64 kg/m  Wt Readings from Last 3 Encounters:  06/26/23 150 lb 6 oz (68.2 kg)  12/19/22 174 lb 12.8 oz (79.3 kg)  11/15/22 182 lb 8 oz (82.8 kg)    Physical Exam   Gen: WDWN NAD HEENT: NCAT, conjunctiva not injected, sclera nonicteric TM WNL L, R TM gray/good landmarks w/some clear fluid behind bottom, OP moist, no exudates  NECK:  supple, no thyromegaly, no nodes, no carotid bruits CARDIAC: RRR, S1S2+, no murmur. DP 2+B LUNGS: CTAB. No wheezes ABDOMEN:  BS+, soft, NTND, No HSM, no masses EXT:  no edema MSK: no gross abnormalities.  NEURO: A&O x3.  CN II-XII intact.  PSYCH: normal mood. Good eye contact     Assessment & Plan:  Attention deficit hyperactivity disorder (ADHD), combined type  Dysfunction of right eustachian tube  Nausea  Other orders -     Ondansetron HCl; Take 1 tablet (4 mg total) by mouth every 8 (eight) hours as needed for nausea or vomiting.  Dispense: 20 tablet; Refill: 1 -     Amphetamine-Dextroamphetamine; Take 1 tablet (20 mg total) by mouth 2 (two) times daily.  Dispense: 60 tablet; Refill: 0 -     Amphetamine-Dextroamphetamine; Take 1 tablet (20 mg total) by mouth 2 (two) times daily.  Dispense: 60 tablet; Refill: 0 -     Amphetamine-Dextroamphetamine; Take 1 tablet (20 mg total) by mouth 2 (two) times daily.  Dispense: 60 tablet; Refill: 0  Assessment and Plan    Post-Gastric Sleeve Surgery Status   One year after gastric sleeve surgery, she has lost 100 pounds and reached her goal weight but is experiencing a plateau. Loose skin is present, a common post-surgical outcome, and BMI may not be accurate due to excess skin. There are no current complications. Emphasis is placed on maintaining weight and increasing exercise. Continue the current exercise regimen and monitor weight and  skin condition.  Eustachian Tube Dysfunction   She reports right ear drainage and fullness for the past three to four weeks, likely related to recent sinus issues. There is no pain, and symptoms are consistent with eustachian tube dysfunction, possibly due to inflammation from r a recent cold. Fluid may take up to twelve weeks to resolve post-cold. Examine the ear for fluid or infection. If no infection is present, recommend Flonase for a couple of weeks.  Motion Sickness   She experiences significant motion sickness, especially during long car rides. Dramamine causes excessive drowsiness, which is unsuitable for her upcoming trip to [place]. Prescribe Zofran for motion sickness.  Attention-Deficit/Hyperactivity Disorder (ADHD)   Currently taking Adderall 20 mg  twice a day, which is effective. She takes a reduced dose on weekends and reports no side effects such as heart palpitations, jitteriness, or suicidal thoughts. The last drug screen in July showed no Adderall, possibly due to high fluid intake or other factors. Discussed the importance of regular drug screening and adherence to the medication schedule. Continue Adderall 20 mg twice a day. Send three prescriptions to the pharmacy at once and call or message for refills every three months. Follow-up in six months.  General Health Maintenance   She is maintaining a healthy lifestyle post-gastric sleeve surgery and incorporating exercise into her routine. Continue current health maintenance practices.  Follow-up   Follow-up in six months for ADHD and general health maintenance.       Return in about 6 months (around 12/24/2023) for adhd./ pap.  Angelena Sole, MD

## 2023-06-26 NOTE — Patient Instructions (Signed)
It was very nice to see you today!  Flonase daily for 2 wks   PLEASE NOTE:  If you had any lab tests please let us know if you have not heard back within a few days. You may see your results on MyChart before we have a chance to review them but we will give you a call once they are reviewed by [place]. If we ordered any referrals today, please let us know if you have not heard from their office within the next week.   Please try these tips to maintain a healthy lifestyle:  Eat most of your calories during the day when you are active. Eliminate processed foods including packaged sweets (pies, cakes, cookies), reduce intake of potatoes, white bread, white pasta, and white rice. Look for whole grain options, oat flour or almond flour.  Each meal should contain half fruits/vegetables, one quarter protein, and one quarter carbs (no bigger than a computer mouse).  Cut down on sweet beverages. This includes juice, soda, and sweet tea. Also watch fruit intake, though this is a healthier sweet option, it still contains natural sugar! Limit to 3 servings daily.  Drink at least 1 glass of water with each meal and aim for at least 8 glasses per day  Exercise at least 150 minutes every week.

## 2023-10-24 ENCOUNTER — Encounter: Payer: Self-pay | Admitting: Family Medicine

## 2023-10-24 ENCOUNTER — Ambulatory Visit (INDEPENDENT_AMBULATORY_CARE_PROVIDER_SITE_OTHER): Admitting: Family Medicine

## 2023-10-24 VITALS — BP 106/70 | HR 86 | Temp 97.9°F | Resp 16 | Ht 63.0 in | Wt 141.5 lb

## 2023-10-24 DIAGNOSIS — F902 Attention-deficit hyperactivity disorder, combined type: Secondary | ICD-10-CM

## 2023-10-24 DIAGNOSIS — Z79899 Other long term (current) drug therapy: Secondary | ICD-10-CM

## 2023-10-24 DIAGNOSIS — H938X1 Other specified disorders of right ear: Secondary | ICD-10-CM

## 2023-10-24 MED ORDER — AMPHETAMINE-DEXTROAMPHETAMINE 20 MG PO TABS: 20.0000 mg | ORAL_TABLET | Freq: Two times a day (BID) | ORAL | 0 refills | Status: DC

## 2023-10-24 MED ORDER — PREDNISONE 20 MG PO TABS: 40.0000 mg | ORAL_TABLET | Freq: Every day | ORAL | 0 refills | Status: AC

## 2023-10-24 MED ORDER — AZITHROMYCIN 250 MG PO TABS: ORAL_TABLET | ORAL | 0 refills | Status: AC

## 2023-10-24 NOTE — Patient Instructions (Addendum)
 It was very nice to see you today!  If not improving call for referral to ENT   PLEASE NOTE:  If you had any lab tests please let us  know if you have not heard back within a few days. You may see your results on MyChart before we have a chance to review them but we will give you a call once they are reviewed by us . If we ordered any referrals today, please let us  know if you have not heard from their office within the next week.   Please try these tips to maintain a healthy lifestyle:  Eat most of your calories during the day when you are active. Eliminate processed foods including packaged sweets (pies, cakes, cookies), reduce intake of potatoes, white bread, white pasta, and white rice. Look for whole grain options, oat flour or almond flour.  Each meal should contain half fruits/vegetables, one quarter protein, and one quarter carbs (no bigger than a computer mouse).  Cut down on sweet beverages. This includes juice, soda, and sweet tea. Also watch fruit intake, though this is a healthier sweet option, it still contains natural sugar! Limit to 3 servings daily.  Drink at least 1 glass of water  with each meal and aim for at least 8 glasses per day  Exercise at least 150 minutes every week.

## 2023-10-24 NOTE — Progress Notes (Signed)
 Subjective:     Patient ID: Teresa Curry, female    DOB: Oct 05, 1985, 38 y.o.   MRN: [id number]  Chief Complaint  Patient presents with   Medication Refill    Need refill of Adderall    Ear Fullness    Right ear stills feel stopped up    HPI Discussed the use of AI scribe software for clinical note transcription with the patient, who gave verbal consent to proceed.  History of Present Illness Teresa Curry is a 38 year old female who presents with persistent ear congestion and discomfort.  She has experienced persistent ear congestion and discomfort since December, following a cold with sinus issues. The ear has felt 'stopped up' consistently for almost six months, but it has become particularly bothersome in the past week. The ear is sore to the touch, and she experiences a sensation of static when speaking. No fevers or chills are present.  She is currently taking Adderall 20 mg, short-acting, twice a day for ADHD. She prefers this regimen as it allows flexibility, especially on weekends when she only takes it once in the morning. The medication causes abrupt bathroom visits but no other side effects such as shakiness, jitteriness, or heart racing.  She underwent gastric bypass surgery and has been following up with annual blood work. Recent blood work for life insurance purposes in February or March showed no concerns. She feels healthier and more comfortable post-surgery, noting improvements in her ability to engage with her children and overall comfort.  She mentions difficulty using Flonase, stating she is 'worse than my 38 year old' in using it. No suicidal thoughts.    There are no preventive care reminders to display for this patient.   Past Medical History:  Diagnosis Date   ADHD    Anemia    during pregnancy   Anxiety    Headache    migraines   IBS (irritable bowel syndrome)    no longer having sx   Kidney stones    Pregnancy induced hypertension     Past  Surgical History:  Procedure Laterality Date   LAPAROSCOPIC GASTRIC SLEEVE RESECTION N/A 03/20/2022   Procedure: LAPAROSCOPIC SLEEVE GASTRECTOMY WITH POSSIBLE HIATAL HERNIA REPAIR;  Surgeon: Aldean Hummingbird, MD;  Location: [facility];  Service: General;  Laterality: N/A;   UPPER GI ENDOSCOPY N/A 03/20/2022   Procedure: UPPER GI ENDOSCOPY;  Surgeon: Aldean Hummingbird, MD;  Location: [facility];  Service: General;  Laterality: N/A;   WISDOM TOOTH EXTRACTION       Current Outpatient Medications:    amphetamine -dextroamphetamine  (ADDERALL) 20 MG tablet, Take 1 tablet (20 mg total) by mouth 2 (two) times daily., Disp: 60 tablet, Rfl: 0   [START ON 11/23/2023] amphetamine -dextroamphetamine  (ADDERALL) 20 MG tablet, Take 1 tablet (20 mg total) by mouth 2 (two) times daily., Disp: 60 tablet, Rfl: 0   [START ON 12/23/2023] amphetamine -dextroamphetamine  (ADDERALL) 20 MG tablet, Take 1 tablet (20 mg total) by mouth 2 (two) times daily., Disp: 60 tablet, Rfl: 0   azithromycin (ZITHROMAX) 250 MG tablet, Take 2 tablets on day 1, then 1 tablet daily on days 2 through 5, Disp: 6 tablet, Rfl: 0   medroxyPROGESTERone  (PROVERA ) 5 MG tablet, Take 1 tablet (5 mg total) by mouth 3 (three) times daily. Start 3 days before menses and take up to 10 days, Disp: 30 tablet, Rfl: 0   Multiple Vitamins-Minerals (BARIATRIC MULTIVITAMINS/IRON PO), Take by mouth., Disp: , Rfl:    ondansetron  (ZOFRAN ) 4 MG  tablet, Take 1 tablet (4 mg total) by mouth every 8 (eight) hours as needed for nausea or vomiting., Disp: 20 tablet, Rfl: 1   predniSONE  (DELTASONE ) 20 MG tablet, Take 2 tablets (40 mg total) by mouth daily with breakfast for 5 days., Disp: 10 tablet, Rfl: 0  Allergies  Allergen Reactions   Morphine And Codeine Itching and Nausea Only   Morphine Itching and Other (See Comments)   ROS neg/noncontributory except as noted HPI/below      Objective:      BP 106/70   Pulse 86   Temp 97.9 F (36.6 C) (Temporal)   Resp 16   Ht 5' 3 (1.6  m)   Wt 141 lb 8 oz (64.2 kg)   LMP 10/02/2023 (Exact Date)   SpO2 100%   BMI 25.07 kg/m  Wt Readings from Last 3 Encounters:  10/24/23 141 lb 8 oz (64.2 kg)  06/26/23 150 lb 6 oz (68.2 kg)  12/19/22 174 lb 12.8 oz (79.3 kg)    Physical Exam   Gen: WDWN NAD HEENT: NCAT, conjunctiva not injected, sclera nonicteric TM WNL L.  R-sl bulge and possibly some clear fluid, OP moist, no exudates  NECK:  supple, no thyromegaly, no nodes, no carotid bruits CARDIAC: RRR, S1S2+, no murmur. DP 2+B LUNGS: CTAB. No wheezes ABDOMEN:  BS+, soft, NTND, No HSM, no masses EXT:  no edema MSK: no gross abnormalities.  NEURO: A&O x3.  CN II-XII intact.  PSYCH: normal mood. Good eye contact     Assessment & Plan:  Attention deficit hyperactivity disorder (ADHD), combined type -     DRUG MONITORING, PANEL 8 WITH CONFIRMATION, URINE  High risk medication use -     DRUG MONITORING, PANEL 8 WITH CONFIRMATION, URINE  Ear fullness, right  Other orders -     Azithromycin; Take 2 tablets on day 1, then 1 tablet daily on days 2 through 5  Dispense: 6 tablet; Refill: 0 -     predniSONE ; Take 2 tablets (40 mg total) by mouth daily with breakfast for 5 days.  Dispense: 10 tablet; Refill: 0 -     Amphetamine -Dextroamphetamine ; Take 1 tablet (20 mg total) by mouth 2 (two) times daily.  Dispense: 60 tablet; Refill: 0 -     Amphetamine -Dextroamphetamine ; Take 1 tablet (20 mg total) by mouth 2 (two) times daily.  Dispense: 60 tablet; Refill: 0 -     Amphetamine -Dextroamphetamine ; Take 1 tablet (20 mg total) by mouth 2 (two) times daily.  Dispense: 60 tablet; Refill: 0  Assessment and Plan Assessment & Plan Eustachian Tube Dysfunction   She experiences persistent ear fullness and occasional soreness since December, following a cold. Fluid in the ear suggests an atypical bacterial infection, with symptoms of ear fullness, soreness to touch, and static-like sounds. No fever or chills are reported. Prescribe  azithromycin (Z-Pak) and prednisone , advising to start prednisone  with food, preferably in the morning. Refer to ENT for further evaluation if symptoms do not improve  Attention Deficit Hyperactivity Disorder (ADHD)   Her ADHD is managed with Adderall 20 mg short-acting twice daily. She occasionally forgets the second dose and prefers not to take it twice daily on weekends to enjoy time with her children. No significant side effects are noted except for increased bowel movements. Continue the current regimen due to personal preference and lifestyle considerations. Perform a urine drug screen. PDMP checked.  3 rx sent to pharm  Post-Bariatric Surgery Monitoring   She underwent gastric bypass surgery and  her blood work is monitored annually through bariatric follow-up and life insurance requirements. No recent lab results are on file since November 2023. Request recent blood work results from life insurance for medical records.  General Health Maintenance   She is encouraged to maintain physical activity despite hot weather to prevent weight regain and improve overall health. Suggest early morning or late evening exercise to avoid heat.    Return in about 3 months (around 01/24/2024) for mood.  Ellsworth Haas, MD

## 2023-10-29 ENCOUNTER — Ambulatory Visit: Payer: Self-pay | Admitting: Family Medicine

## 2023-10-29 NOTE — Progress Notes (Signed)
 As expected

## 2023-11-03 ENCOUNTER — Encounter: Payer: Self-pay | Admitting: Family Medicine

## 2023-11-05 ENCOUNTER — Other Ambulatory Visit: Payer: Self-pay | Admitting: *Deleted

## 2023-11-05 DIAGNOSIS — H938X1 Other specified disorders of right ear: Secondary | ICD-10-CM

## 2023-11-13 ENCOUNTER — Encounter (INDEPENDENT_AMBULATORY_CARE_PROVIDER_SITE_OTHER): Payer: Self-pay | Admitting: Physician Assistant

## 2023-11-13 ENCOUNTER — Ambulatory Visit (INDEPENDENT_AMBULATORY_CARE_PROVIDER_SITE_OTHER): Admitting: Physician Assistant

## 2023-11-13 ENCOUNTER — Ambulatory Visit (INDEPENDENT_AMBULATORY_CARE_PROVIDER_SITE_OTHER): Admitting: Audiology

## 2023-11-13 VITALS — BP 103/70 | HR 92

## 2023-11-13 DIAGNOSIS — H6993 Unspecified Eustachian tube disorder, bilateral: Secondary | ICD-10-CM

## 2023-11-13 DIAGNOSIS — H6990 Unspecified Eustachian tube disorder, unspecified ear: Secondary | ICD-10-CM | POA: Diagnosis not present

## 2023-11-13 DIAGNOSIS — H93293 Other abnormal auditory perceptions, bilateral: Secondary | ICD-10-CM

## 2023-11-13 DIAGNOSIS — Z0110 Encounter for examination of ears and hearing without abnormal findings: Secondary | ICD-10-CM

## 2023-11-13 MED ORDER — FLUTICASONE PROPIONATE 50 MCG/ACT NA SUSP: 2.0000 | Freq: Every day | NASAL | 6 refills | Status: AC

## 2023-11-13 MED ORDER — LORATADINE 10 MG PO TABS
10.0000 mg | ORAL_TABLET | Freq: Every day | ORAL | 11 refills | Status: AC
Start: 2023-11-13 — End: ?

## 2023-11-13 NOTE — Progress Notes (Signed)
  [address], [address] [place], [place] [address] [phone]  Audiological Evaluation    Name: Teresa Curry     DOB:   Dec 28, 1985      MRN:   [id number]                                                                                     Service Date: 11/13/2023     Accompanied by: unaccompanied   Patient comes today after Reyes Cohen, PA-C sent a referral for a hearing evaluation due to concerns with ear fullness.   Symptoms Yes Details  Hearing loss  []    Tinnitus  [x]  Recently started hearing some static  Ear pain/ infections/pressure  [x]  Fullness in the right ear since December 2025 , with time she feels it more often and recently started feeling it in the left ear  Balance problems  [x]  Has had 2 episodes when she wakes up in the morning of feeling disoriented, and like a sway to the side  Noise exposure history  []    Previous ear surgeries  []    Family history of hearing loss  []    Amplification  []    Other  [x]  Grinds her teeth at night    Otoscopy: Right ear: Clear external ear canal and notable landmarks visualized on the tympanic membrane. Left ear:  Clear external ear canal and notable landmarks visualized on the tympanic membrane.  Tympanometry: Right ear: Type A- Normal external ear canal volume with normal middle ear pressure and tympanic membrane compliance. Left ear: Type A- Normal external ear canal volume with normal middle ear pressure and tympanic membrane compliance.   Pure tone Audiometry: Right ear- Normal hearing from [phone] Hz.  Left ear-  Normal hearing from [phone] Hz.   Speech Audiometry: Right ear- Speech Reception Threshold (SRT) was obtained at 5 dBHL. Left ear-Speech Reception Threshold (SRT) was obtained at 5 dBHL.   Word Recognition Score Tested using NU-6 (recorded) Right ear: 100% was obtained at a presentation level of 50 dBHL with contralateral masking which is deemed as  excellent. Left ear: 100% was obtained at a  presentation level of 50 dBHL with contralateral masking which is deemed as  excellent.   The hearing test results were completed under headphones and results are deemed to be of good to fair reliability. Test technique:  conventional     Recommendations: Follow up with ENT as scheduled for today., Return for a hearing evaluation if concerns with hearing changes arise or per MD recommendation.   Daiki Dicostanzo MARIE LEROUX-MARTINEZ, AUD

## 2023-11-13 NOTE — Progress Notes (Signed)
 Dear Dr. Wendolyn, Here is my assessment for our mutual patient, Teresa Curry. Thank you for allowing me the opportunity to care for your patient. Please do not hesitate to contact me should you have any other questions. Sincerely, Chyrl Cohen PA-C  Otolaryngology Clinic Note Referring provider: Dr. Wendolyn HPI:  Teresa Curry is a 38 y.o. female kindly referred by Dr. Wendolyn   The patient is a 38 year old female seen in our office for evaluation of ear fullness.  The patient notes that approximately 7 months ago she had an upper respiratory infection with ear congestion.  She notes that her symptoms of congestion have persisted since that time.  She feels fullness, she denies any significant pain, no clicking or popping, some minimal dizziness with no associated neurologic symptoms.  No ringing in the ears.,  No drainage.  She denies any history of recurrent episodes of otitis media as a child or an adult.  No history of similar symptoms.  This was predominantly unilateral on the right but 2 days ago she started having similar symptoms on the left.  No history of head or neck surgeries.  She did have gastric bypass in 2023.  Given the symptoms she followed up with her primary care provider, she was given Z-Pak, prednisone  she notes this did help her symptoms but they again returned after stopping the prednisone .  She has a history of seasonal allergies and takes Claritin as needed, noting she has not taken it since March.  No pain at the MJ.       Independent Review of Additional Tests or Records:  Audiological evaluation on 11/13/2023  Otoscopy: Right ear: Clear external ear canal and notable landmarks visualized on the tympanic membrane. Left ear:  Clear external ear canal and notable landmarks visualized on the tympanic membrane.   Tympanometry: Right ear: Type A- Normal external ear canal volume with normal middle ear pressure and tympanic membrane compliance. Left ear: Type A- Normal external ear  canal volume with normal middle ear pressure and tympanic membrane compliance.     Pure tone Audiometry: Right ear- Normal hearing from [phone] Hz.  Left ear-  Normal hearing from [phone] Hz.     Speech Audiometry: Right ear- Speech Reception Threshold (SRT) was obtained at 5 dBHL. Left ear-Speech Reception Threshold (SRT) was obtained at 5 dBHL.   Word Recognition Score Tested using NU-6 (recorded) Right ear: 100% was obtained at a presentation level of 50 dBHL with contralateral masking which is deemed as  excellent. Left ear: 100% was obtained at a presentation level of 50 dBHL with contralateral masking which is deemed as  excellent.   The hearing test results were completed under headphones and results are deemed to be of good to fair reliability. Test technique:  conventional     PMH/Meds/All/SocHx/FamHx/ROS:   Past Medical History:  Diagnosis Date   ADHD    Anemia    during pregnancy   Anxiety    Headache    migraines   IBS (irritable bowel syndrome)    no longer having sx   Kidney stones    Pregnancy induced hypertension      Past Surgical History:  Procedure Laterality Date   LAPAROSCOPIC GASTRIC SLEEVE RESECTION N/A 03/20/2022   Procedure: LAPAROSCOPIC SLEEVE GASTRECTOMY WITH POSSIBLE HIATAL HERNIA REPAIR;  Surgeon: Tanda Locus, MD;  Location: [facility];  Service: General;  Laterality: N/A;   UPPER GI ENDOSCOPY N/A 03/20/2022   Procedure: UPPER GI ENDOSCOPY;  Surgeon: Tanda Locus, MD;  Location:  [facility];  Service: General;  Laterality: N/A;   WISDOM TOOTH EXTRACTION      Family History  Problem Relation Age of Onset   Hypertension Mother    Cancer Mother    Hearing loss Father    Diabetes Father    Cancer Maternal Grandmother    Heart disease Paternal Grandfather      Social Connections: Socially Integrated (06/26/2023)   Social Connection and Isolation Panel    Frequency of Communication with Friends and Family: Three times a week    Frequency of  Social Gatherings with Friends and Family: Once a week    Attends Religious Services: 1 to 4 times per year    Active Member of [employer] or Organizations: Yes    Attends Banker Meetings: 1 to 4 times per year    Marital Status: Married      Current Outpatient Medications:    amphetamine -dextroamphetamine  (ADDERALL) 20 MG tablet, Take 1 tablet (20 mg total) by mouth 2 (two) times daily., Disp: 60 tablet, Rfl: 0   [START ON 11/23/2023] amphetamine -dextroamphetamine  (ADDERALL) 20 MG tablet, Take 1 tablet (20 mg total) by mouth 2 (two) times daily., Disp: 60 tablet, Rfl: 0   [START ON 12/23/2023] amphetamine -dextroamphetamine  (ADDERALL) 20 MG tablet, Take 1 tablet (20 mg total) by mouth 2 (two) times daily., Disp: 60 tablet, Rfl: 0   medroxyPROGESTERone  (PROVERA ) 5 MG tablet, Take 1 tablet (5 mg total) by mouth 3 (three) times daily. Start 3 days before menses and take up to 10 days, Disp: 30 tablet, Rfl: 0   Multiple Vitamins-Minerals (BARIATRIC MULTIVITAMINS/IRON PO), Take by mouth., Disp: , Rfl:    ondansetron  (ZOFRAN ) 4 MG tablet, Take 1 tablet (4 mg total) by mouth every 8 (eight) hours as needed for nausea or vomiting., Disp: 20 tablet, Rfl: 1   Physical Exam:   BP 103/70   Pulse 92   LMP 10/02/2023 (Exact Date)   SpO2 98%   Pertinent Findings  CN II-XII intact Bilateral EAC clear and TM intact with well pneumatized middle ear spaces Weber 512: equal Rinne 512: AC > BC b/l  Anterior rhinoscopy: Septum midline; bilateral inferior turbinates with minimal hypertrophy No lesions of oral cavity/oropharynx; dentition in normal limits No obviously palpable neck masses/lymphadenopathy/thyromegaly No respiratory distress or stridor  Seprately Identifiable Procedures:  None  Impression & Plans:  Teresa Curry is a 38 y.o. female with the following   Eustachian tube dysfunction-  38 year old female seen in our office for evaluation of ear fullness.  Her symptoms are most  consistent with eustachian tube dysfunction.  She has a reassuring physical exam and audiological exam with no significant abnormalities.  I have recommended she start using Flonase daily along with a daily antihistamine and nasal saline irrigation.  I would like to see her back in the office in 3 months for repeat evaluation or sooner as needed.  She verbalized understanding and agreement to today's plan.   - f/u 3 months   Thank you for allowing me the opportunity to care for your patient. Please do not hesitate to contact me should you have any other questions.  Sincerely, Chyrl Cohen PA-C [facility] Phone: [phone] Fax: [phone]  11/13/2023, 9:36 AM

## 2023-11-20 ENCOUNTER — Encounter: Payer: Self-pay | Admitting: Audiology

## 2024-01-29 ENCOUNTER — Ambulatory Visit (INDEPENDENT_AMBULATORY_CARE_PROVIDER_SITE_OTHER): Admitting: Family Medicine

## 2024-01-29 ENCOUNTER — Encounter: Payer: Self-pay | Admitting: Family Medicine

## 2024-01-29 VITALS — BP 103/69 | HR 58 | Temp 97.5°F | Resp 16 | Ht 63.0 in | Wt 143.0 lb

## 2024-01-29 DIAGNOSIS — N946 Dysmenorrhea, unspecified: Secondary | ICD-10-CM

## 2024-01-29 DIAGNOSIS — Z9884 Bariatric surgery status: Secondary | ICD-10-CM | POA: Diagnosis not present

## 2024-01-29 DIAGNOSIS — F902 Attention-deficit hyperactivity disorder, combined type: Secondary | ICD-10-CM

## 2024-01-29 NOTE — Patient Instructions (Addendum)
 It was very nice to see you today!  Get copy of labs  See gyn  Try the ibuprofen  starting 2-3 days prior.     PLEASE NOTE:  If you had any lab tests please let us  know if you have not heard back within a few days. You may see your results on MyChart before we have a chance to review them but we will give you a call once they are reviewed by us . If we ordered any referrals today, please let us  know if you have not heard from their office within the next week.   Please try these tips to maintain a healthy lifestyle:  Eat most of your calories during the day when you are active. Eliminate processed foods including packaged sweets (pies, cakes, cookies), reduce intake of potatoes, white bread, white pasta, and white rice. Look for whole grain options, oat flour or almond flour.  Each meal should contain half fruits/vegetables, one quarter protein, and one quarter carbs (no bigger than a computer mouse).  Cut down on sweet beverages. This includes juice, soda, and sweet tea. Also watch fruit intake, though this is a healthier sweet option, it still contains natural sugar! Limit to 3 servings daily.  Drink at least 1 glass of water  with each meal and aim for at least 8 glasses per day  Exercise at least 150 minutes every week.

## 2024-01-29 NOTE — Progress Notes (Signed)
 Subjective:     Patient ID: Teresa Curry, female    DOB: 22-May-1985, 38 y.o.   MRN: [id number]  Chief Complaint  Patient presents with   Follow-up    3 month follow-up on moods     HPI Discussed the use of AI scribe software for clinical note transcription with the patient, who gave verbal consent to proceed.  History of Present Illness Teresa Curry is a 38 year old female who presents for follow-up.  She is currently taking Adderall 20 mg in the morning and 10 mg in the afternoon for ADD. This regimen is effective, although she breaks the second dose in half to avoid insomnia. No heart racing, shakiness, jitteriness, or thoughts of suicide.  She has a history of bariatric surgery and her weight loss has stabilized. She is in a maintenance phase and incorporates exercise into her routine.  Will send labs from insurance  She experiences severe pain on the first day of her menstrual period, describing it as 'aching pain' and feeling like her 'vagina's going to fall out.' The pain is so intense that she cannot stand up. This pain is similar to what she experienced during pregnancy, which she attributed to extra body weight. Her periods are now regular and predictable after her weight loss, although they are heavier than when she was on birth control, but not excessively so. No heavy bleeding or clotting.  She has not had a Pap smear since 2023 and has faced difficulties scheduling one due to cancellations, possibly related to insurance issues.    Health Maintenance Due  Topic Date Due   Hepatitis B Vaccines 19-59 Average Risk (1 of 3 - 19+ 3-dose series) Never done   HPV VACCINES (1 - 3-dose SCDM series) Never done   Influenza Vaccine  12/14/2023   Cervical Cancer Screening (HPV/Pap Cotest)  01/19/2024    Past Medical History:  Diagnosis Date   ADHD    Anemia    during pregnancy   Anxiety    Headache    migraines   IBS (irritable bowel syndrome)    no longer having sx    Kidney stones    Pregnancy induced hypertension     Past Surgical History:  Procedure Laterality Date   LAPAROSCOPIC GASTRIC SLEEVE RESECTION N/A 03/20/2022   Procedure: LAPAROSCOPIC SLEEVE GASTRECTOMY WITH POSSIBLE HIATAL HERNIA REPAIR;  Surgeon: Tanda Locus, MD;  Location: [facility];  Service: General;  Laterality: N/A;   UPPER GI ENDOSCOPY N/A 03/20/2022   Procedure: UPPER GI ENDOSCOPY;  Surgeon: Tanda Locus, MD;  Location: [facility];  Service: General;  Laterality: N/A;   WISDOM TOOTH EXTRACTION       Current Outpatient Medications:    amphetamine -dextroamphetamine  (ADDERALL) 20 MG tablet, Take 1 tablet (20 mg total) by mouth 2 (two) times daily., Disp: 60 tablet, Rfl: 0   amphetamine -dextroamphetamine  (ADDERALL) 20 MG tablet, Take 1 tablet (20 mg total) by mouth 2 (two) times daily., Disp: 60 tablet, Rfl: 0   amphetamine -dextroamphetamine  (ADDERALL) 20 MG tablet, Take 1 tablet (20 mg total) by mouth 2 (two) times daily., Disp: 60 tablet, Rfl: 0   fluticasone  (FLONASE ) 50 MCG/ACT nasal spray, Place 2 sprays into both nostrils daily., Disp: 16 g, Rfl: 6   loratadine  (CLARITIN ) 10 MG tablet, Take 1 tablet (10 mg total) by mouth daily., Disp: 30 tablet, Rfl: 11   medroxyPROGESTERone  (PROVERA ) 5 MG tablet, Take 1 tablet (5 mg total) by mouth 3 (three) times daily. Start 3 days  before menses and take up to 10 days, Disp: 30 tablet, Rfl: 0   Multiple Vitamins-Minerals (BARIATRIC MULTIVITAMINS/IRON PO), Take by mouth., Disp: , Rfl:    ondansetron  (ZOFRAN ) 4 MG tablet, Take 1 tablet (4 mg total) by mouth every 8 (eight) hours as needed for nausea or vomiting., Disp: 20 tablet, Rfl: 1  Allergies  Allergen Reactions   Morphine And Codeine Itching and Nausea Only   Morphine Itching and Other (See Comments)   ROS neg/noncontributory except as noted HPI/below      Objective:     BP 103/69   Pulse (!) 58   Temp (!) 97.5 F (36.4 C) (Temporal)   Resp 16   Ht 5' 3 (1.6 m)   Wt 143 lb (64.9  kg)   LMP 01/19/2024 (Exact Date)   SpO2 100%   Breastfeeding Unknown   BMI 25.33 kg/m  Wt Readings from Last 3 Encounters:  01/29/24 143 lb (64.9 kg)  10/24/23 141 lb 8 oz (64.2 kg)  06/26/23 150 lb 6 oz (68.2 kg)    Physical Exam   Gen: WDWN NAD HEENT: NCAT, conjunctiva not injected, sclera nonicteric NECK:  supple, no thyromegaly, no nodes, no carotid bruits CARDIAC: RRR, S1S2+, no murmur. DP 2+B LUNGS: CTAB. No wheezes ABDOMEN:  BS+, soft, NTND, No HSM, no masses EXT:  no edema MSK: no gross abnormalities.  NEURO: A&O x3.  CN II-XII intact.  PSYCH: normal mood. Good eye contact  Pdmp checked     Assessment & Plan:  Attention deficit hyperactivity disorder (ADHD), combined type  S/P laparoscopic sleeve gastrectomy  Dysmenorrhea  Assessment and Plan Assessment & Plan Dysmenorrhea   She experiences severe aching pain on the first day of menstruation, similar to pregnancy symptoms, though her periods are regular without heavy bleeding. Differential diagnosis includes dysmenorrhea and possible pelvic floor dysfunction. Discussed prostaglandins' role in menstrual pain and the benefits of preemptive NSAID use. Advise taking ibuprofen  three times daily, starting two to three days before menstruation. Encourage scheduling a gynecologist appointment to address menstrual pain and potential pelvic floor issues.  Attention-deficit disorder (ADD), predominantly inattentive type   ADD is well-managed with her current medication regimen. She takes Adderall 20 mg in the morning and 10 mg in the afternoon to avoid insomnia, with no side effects like heart racing, shakiness, or suicidal thoughts. Continue Adderall as prescribed. Instruct her to message the office for refills, allowing at least 48 hours for processing.  Status post bariatric surgery   She has achieved desired weight loss and is in the maintenance phase, with exercise being crucial.  General Health Maintenance   She is  overdue for a Pap smear, last done in 2023. Discussed updated guidelines for Pap smear frequency based on age and HPV co-testing. Encourage scheduling a Pap smear with a gynecologist, emphasizing the importance of addressing both annual screening and menstrual pain concerns.    Return in about 3 months (around 04/29/2024) for ADD.  Jenkins CHRISTELLA Carrel, MD

## 2024-02-13 ENCOUNTER — Ambulatory Visit (INDEPENDENT_AMBULATORY_CARE_PROVIDER_SITE_OTHER): Admitting: Physician Assistant

## 2024-02-13 NOTE — Progress Notes (Deleted)
 Dear Dr. Wendolyn, Here is my assessment for our mutual patient, Teresa Curry. Thank you for allowing me the opportunity to care for your patient. Please do not hesitate to contact me should you have any other questions. Sincerely, Chyrl Cohen PA-C  Otolaryngology Clinic Note Referring provider: Dr. Wendolyn HPI:  Teresa Curry is a 38 y.o. female kindly referred by Dr. Wendolyn   The patient is a 38 year old female seen in our office for follow-up evaluation of station tube dysfunction.  The patient was last seen in the office on 11/13/2023.  Below is recap of encounter.  Update 02/13/2024     Independent Review of Additional Tests or Records:  ***   PMH/Meds/All/SocHx/FamHx/ROS:   Past Medical History:  Diagnosis Date   ADHD    Anemia    during pregnancy   Anxiety    Headache    migraines   IBS (irritable bowel syndrome)    no longer having sx   Kidney stones    Pregnancy induced hypertension      Past Surgical History:  Procedure Laterality Date   LAPAROSCOPIC GASTRIC SLEEVE RESECTION N/A 03/20/2022   Procedure: LAPAROSCOPIC SLEEVE GASTRECTOMY WITH POSSIBLE HIATAL HERNIA REPAIR;  Surgeon: Tanda Locus, MD;  Location: [facility];  Service: General;  Laterality: N/A;   UPPER GI ENDOSCOPY N/A 03/20/2022   Procedure: UPPER GI ENDOSCOPY;  Surgeon: Tanda Locus, MD;  Location: [facility];  Service: General;  Laterality: N/A;   WISDOM TOOTH EXTRACTION      Family History  Problem Relation Age of Onset   Hypertension Mother    Cancer Mother    Hearing loss Father    Diabetes Father    Cancer Maternal Grandmother    Heart disease Paternal Grandfather      Social Connections: Socially Integrated (06/26/2023)   Social Connection and Isolation Panel    Frequency of Communication with Friends and Family: Three times a week    Frequency of Social Gatherings with Friends and Family: Once a week    Attends Religious Services: 1 to 4 times per year    Active Member of [employer] or Organizations: Yes     Attends Banker Meetings: 1 to 4 times per year    Marital Status: Married      Current Outpatient Medications:    amphetamine -dextroamphetamine  (ADDERALL) 20 MG tablet, Take 1 tablet (20 mg total) by mouth 2 (two) times daily., Disp: 60 tablet, Rfl: 0   amphetamine -dextroamphetamine  (ADDERALL) 20 MG tablet, Take 1 tablet (20 mg total) by mouth 2 (two) times daily., Disp: 60 tablet, Rfl: 0   amphetamine -dextroamphetamine  (ADDERALL) 20 MG tablet, Take 1 tablet (20 mg total) by mouth 2 (two) times daily., Disp: 60 tablet, Rfl: 0   fluticasone  (FLONASE ) 50 MCG/ACT nasal spray, Place 2 sprays into both nostrils daily., Disp: 16 g, Rfl: 6   loratadine  (CLARITIN ) 10 MG tablet, Take 1 tablet (10 mg total) by mouth daily., Disp: 30 tablet, Rfl: 11   medroxyPROGESTERone  (PROVERA ) 5 MG tablet, Take 1 tablet (5 mg total) by mouth 3 (three) times daily. Start 3 days before menses and take up to 10 days, Disp: 30 tablet, Rfl: 0   Multiple Vitamins-Minerals (BARIATRIC MULTIVITAMINS/IRON PO), Take by mouth., Disp: , Rfl:    ondansetron  (ZOFRAN ) 4 MG tablet, Take 1 tablet (4 mg total) by mouth every 8 (eight) hours as needed for nausea or vomiting., Disp: 20 tablet, Rfl: 1   Physical Exam:   LMP 01/19/2024 (Exact Date)   Pertinent  Findings  CN II-XII intact ***Bilateral EAC clear and TM intact with well pneumatized middle ear spaces Weber 512: equal Rinne 512: AC > BC b/l  Anterior rhinoscopy: Septum ***; bilateral inferior turbinates with *** No lesions of oral cavity/oropharynx; dentition *** No obviously palpable neck masses/lymphadenopathy/thyromegaly No respiratory distress or stridor  Seprately Identifiable Procedures:  None***  Impression & Plans:  Teresa Curry is a 38 y.o. female with the following   ***   - f/u ***   Thank you for allowing me the opportunity to care for your patient. Please do not hesitate to contact me should you have any other  questions.  Sincerely, Chyrl Cohen PA-C [facility] Phone: [phone] Fax: [phone]  02/13/2024, 10:58 AM

## 2024-04-30 ENCOUNTER — Ambulatory Visit: Admitting: Family Medicine

## 2024-05-27 ENCOUNTER — Ambulatory Visit: Payer: Self-pay | Admitting: Family Medicine

## 2024-05-27 ENCOUNTER — Encounter: Payer: Self-pay | Admitting: Family Medicine

## 2024-05-27 ENCOUNTER — Ambulatory Visit: Admitting: Family Medicine

## 2024-05-27 VITALS — BP 110/70 | HR 74 | Temp 97.9°F | Ht 63.0 in | Wt 150.2 lb

## 2024-05-27 DIAGNOSIS — F902 Attention-deficit hyperactivity disorder, combined type: Secondary | ICD-10-CM

## 2024-05-27 DIAGNOSIS — E010 Iodine-deficiency related diffuse (endemic) goiter: Secondary | ICD-10-CM

## 2024-05-27 LAB — T4, FREE: Free T4: 0.84 ng/dL (ref 0.60–1.60)

## 2024-05-27 LAB — COMPREHENSIVE METABOLIC PANEL WITH GFR
ALT: 9 U/L (ref 3–35)
AST: 13 U/L (ref 5–37)
Albumin: 4.3 g/dL (ref 3.5–5.2)
Alkaline Phosphatase: 52 U/L (ref 39–117)
BUN: 9 mg/dL (ref 6–23)
CO2: 29 meq/L (ref 19–32)
Calcium: 8.9 mg/dL (ref 8.4–10.5)
Chloride: 102 meq/L (ref 96–112)
Creatinine, Ser: 0.53 mg/dL (ref 0.40–1.20)
GFR: 116.73 mL/min
Glucose, Bld: 89 mg/dL (ref 70–99)
Potassium: 4.5 meq/L (ref 3.5–5.1)
Sodium: 137 meq/L (ref 135–145)
Total Bilirubin: 0.5 mg/dL (ref 0.2–1.2)
Total Protein: 7 g/dL (ref 6.0–8.3)

## 2024-05-27 LAB — CBC WITH DIFFERENTIAL/PLATELET
Basophils Absolute: 0 K/uL (ref 0.0–0.1)
Basophils Relative: 0.5 % (ref 0.0–3.0)
Eosinophils Absolute: 0.1 K/uL (ref 0.0–0.7)
Eosinophils Relative: 2.8 % (ref 0.0–5.0)
HCT: 38.4 % (ref 36.0–46.0)
Hemoglobin: 13.2 g/dL (ref 12.0–15.0)
Lymphocytes Relative: 28.3 % (ref 12.0–46.0)
Lymphs Abs: 1.3 K/uL (ref 0.7–4.0)
MCHC: 34.3 g/dL (ref 30.0–36.0)
MCV: 88.7 fl (ref 78.0–100.0)
Monocytes Absolute: 0.4 K/uL (ref 0.1–1.0)
Monocytes Relative: 9.7 % (ref 3.0–12.0)
Neutro Abs: 2.6 K/uL (ref 1.4–7.7)
Neutrophils Relative %: 58.7 % (ref 43.0–77.0)
Platelets: 199 K/uL (ref 150.0–400.0)
RBC: 4.33 Mil/uL (ref 3.87–5.11)
RDW: 13.1 % (ref 11.5–15.5)
WBC: 4.5 K/uL (ref 4.0–10.5)

## 2024-05-27 LAB — LIPID PANEL
Cholesterol: 163 mg/dL (ref 28–200)
HDL: 78.4 mg/dL
LDL Cholesterol: 70 mg/dL (ref 10–99)
NonHDL: 84.37
Total CHOL/HDL Ratio: 2
Triglycerides: 73 mg/dL (ref 10.0–149.0)
VLDL: 14.6 mg/dL (ref 0.0–40.0)

## 2024-05-27 LAB — T3, FREE: T3, Free: 3.3 pg/mL (ref 2.3–4.2)

## 2024-05-27 LAB — TSH: TSH: 1.93 u[IU]/mL (ref 0.35–5.50)

## 2024-05-27 MED ORDER — AMPHETAMINE-DEXTROAMPHETAMINE 20 MG PO TABS: 20.0000 mg | ORAL_TABLET | Freq: Two times a day (BID) | ORAL | 0 refills | Status: AC

## 2024-05-27 NOTE — Progress Notes (Signed)
 Last read by Cecillia DELENA Sharps at 12:53PM on 05/27/2024.

## 2024-05-27 NOTE — Progress Notes (Signed)
 Labs great Await u/s

## 2024-05-27 NOTE — Progress Notes (Signed)
 "  Subjective:     Patient ID: Teresa Curry, female    DOB: 1985-10-09, 39 y.o.   MRN: [id number]  Chief Complaint  Patient presents with   ADHD    Pt is here for follow up on ADHD    Discussed the use of AI scribe software for clinical note transcription with the patient, who gave verbal consent to proceed.  History of Present Illness Teresa Curry is a 39 year old female who presents f/u ADHD.  She has gained seven pounds since her last visit, attributing this to changes in her routine during the holiday season. She has not gained weight since her bariatric surgery until recently and is working to return to her previous weight of around 140 pounds. Her exercise routine was disrupted while her daughter was home from school, and she is now attempting to re-establish her previous habits.  She is currently taking medication for ADHD, specifically 20 mg in the morning and 20 mg in the afternoon, but only takes it once on weekends. During the holiday break, she reduced her medication intake to avoid feeling overly focused on unimportant tasks. She has possible side effects such as shakiness and jitteriness if she does not eat before taking her medication. No SI  She is unsure if her thyroid  labs are being checked as part of her routine follow-up for bariatric surgery. Her previous labs were done through [facility], and she will ensure that copies are sent to her current provider.    There are no preventive care reminders to display for this patient.   Past Medical History:  Diagnosis Date   ADHD    Anemia    during pregnancy   Anxiety    Headache    migraines   IBS (irritable bowel syndrome)    no longer having sx   Kidney stones    Pregnancy induced hypertension     Past Surgical History:  Procedure Laterality Date   LAPAROSCOPIC GASTRIC SLEEVE RESECTION N/A 03/20/2022   Procedure: LAPAROSCOPIC SLEEVE GASTRECTOMY WITH POSSIBLE HIATAL HERNIA REPAIR;  Surgeon: Tanda Locus, MD;   Location: [facility];  Service: General;  Laterality: N/A;   UPPER GI ENDOSCOPY N/A 03/20/2022   Procedure: UPPER GI ENDOSCOPY;  Surgeon: Tanda Locus, MD;  Location: [facility];  Service: General;  Laterality: N/A;   WISDOM TOOTH EXTRACTION      Current Medications[1]  Allergies[2] ROS neg/noncontributory except as noted HPI/below      Objective:     BP 110/70 (BP Location: Left Arm, Patient Position: Sitting, Cuff Size: Normal)   Pulse 74   Temp 97.9 F (36.6 C) (Temporal)   Ht 5' 3 (1.6 m)   Wt 150 lb 4 oz (68.2 kg)   LMP 05/13/2024 (Exact Date)   SpO2 98%   Breastfeeding No   BMI 26.62 kg/m  Wt Readings from Last 3 Encounters:  05/27/24 150 lb 4 oz (68.2 kg)  01/29/24 143 lb (64.9 kg)  10/24/23 141 lb 8 oz (64.2 kg)    Physical Exam GENERAL: Well developed, well nourished, no acute distress. HEAD EYES EARS NOSE THROAT: Normocephalic, atraumatic, conjunctiva not injected, sclera nonicteric. CARDIAC: Regular rate and rhythm, S1 S2 present, no murmur,  NECK: Supple, thyroid  enlarged on the right side, no nodes, no carotid bruits. LUNGS: Clear to auscultation bilaterally, no wheezes. ABDOMEN: Bowel sounds present, soft, non-tender, non-distended, no hepatosplenomegaly, no masses. EXTREMITIES: No edema. MUSCULOSKELETAL: No gross abnormalities. NEUROLOGICAL: Alert and oriented x3, cranial nerves II through  XII intact. PSYCHIATRIC: Normal mood, good eye contact.       Assessment & Plan:  Attention deficit hyperactivity disorder (ADHD), combined type  Thyromegaly -     T3, free -     T4, free -     TSH -     Comprehensive metabolic panel with GFR -     CBC with Differential/Platelet -     Lipid panel -     US  THYROID ; Future  Other orders -     Amphetamine -Dextroamphetamine ; Take 1 tablet (20 mg total) by mouth 2 (two) times daily.  Dispense: 60 tablet; Refill: 0 -     Amphetamine -Dextroamphetamine ; Take 1 tablet (20 mg total) by mouth 2 (two) times daily.  Dispense:  60 tablet; Refill: 0 -     Amphetamine -Dextroamphetamine ; Take 1 tablet (20 mg total) by mouth 2 (two) times daily.  Dispense: 60 tablet; Refill: 0    Assessment and Plan Assessment & Plan Attention-deficit hyperactivity disorder, combined type   ADHD is managed with medication, taken as 20 mg in the morning and 20 mg in the afternoon, with a reduced dose on weekends. Continue adderall: 20 mg in the morning and 20 mg in the afternoon.   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 Further evaluation is necessary to rule out significant pathology. Ordered thyroid  ultrasound to evaluate for nodule. Ordered thyroid  function tests to assess thyroid  activity. Ordered comprehensive blood work to evaluate overall health and potential thyroid -related effects.  Status post bariatric surgery with weight management   She has gained 7 pounds since the last visit, attributed to decreased exercise and increased snacking during the holidays. She is aware of the need to return to previous exercise and dietary habits to manage weight effectively. Encouraged resumption of regular exercise routine. Advised on mindful eating habits to prevent further weight gain. Requested follow-up labs from bariatric surgery team to monitor nutritional status.     Return in about 3 months (around 08/25/2024) for ADHD.  Jenkins CHRISTELLA Carrel, MD      [1]  Current Outpatient Medications:    amphetamine -dextroamphetamine  (ADDERALL) 20 MG tablet, Take 1 tablet (20 mg total) by mouth 2 (two) times daily., Disp: 60 tablet, Rfl: 0   [START ON 06/26/2024] amphetamine -dextroamphetamine  (ADDERALL) 20 MG tablet, Take 1 tablet (20 mg total) by mouth 2 (two) times daily., Disp: 60 tablet, Rfl: 0   [START ON 07/26/2024] amphetamine -dextroamphetamine  (ADDERALL) 20 MG tablet, Take 1 tablet (20 mg total) by mouth 2 (two) times daily., Disp: 60  tablet, Rfl: 0   fluticasone  (FLONASE ) 50 MCG/ACT nasal spray, Place 2 sprays into both nostrils daily., Disp: 16 g, Rfl: 6   loratadine  (CLARITIN ) 10 MG tablet, Take 1 tablet (10 mg total) by mouth daily., Disp: 30 tablet, Rfl: 11   Multiple Vitamins-Minerals (BARIATRIC MULTIVITAMINS/IRON PO), Take by mouth., Disp: , Rfl:    ondansetron  (ZOFRAN ) 4 MG tablet, Take 1 tablet (4 mg total) by mouth every 8 (eight) hours as needed for nausea or vomiting., Disp: 20 tablet, Rfl: 1 [2]  Allergies Allergen Reactions   Morphine And Codeine Itching and Nausea Only   Morphine Itching and Other (See Comments)   "

## 2024-05-27 NOTE — Patient Instructions (Signed)
 It was very nice to see you today!  Lab results please   PLEASE NOTE:  If you had any lab tests please let us  know if you have not heard back within a few days. You may see your results on MyChart before we have a chance to review them but we will give you a call once they are reviewed by us . If we ordered any referrals today, please let us  know if you have not heard from their office within the next week.   Please try these tips to maintain a healthy lifestyle:  Eat most of your calories during the day when you are active. Eliminate processed foods including packaged sweets (pies, cakes, cookies), reduce intake of potatoes, white bread, white pasta, and white rice. Look for whole grain options, oat flour or almond flour.  Each meal should contain half fruits/vegetables, one quarter protein, and one quarter carbs (no bigger than a computer mouse).  Cut down on sweet beverages. This includes juice, soda, and sweet tea. Also watch fruit intake, though this is a healthier sweet option, it still contains natural sugar! Limit to 3 servings daily.  Drink at least 1 glass of water  with each meal and aim for at least 8 glasses per day  Exercise at least 150 minutes every week.

## 2024-06-09 ENCOUNTER — Other Ambulatory Visit

## 2024-06-13 ENCOUNTER — Other Ambulatory Visit

## 2024-06-18 ENCOUNTER — Other Ambulatory Visit

## 2024-06-24 ENCOUNTER — Other Ambulatory Visit

## 2024-08-28 ENCOUNTER — Ambulatory Visit: Admitting: Family Medicine
